# Patient Record
Sex: Female | Born: 1955
Health system: Southern US, Community
[De-identification: ages and names within clinical notes are randomized; demographics above are authoritative.]

## PROBLEM LIST (undated history)

## (undated) DIAGNOSIS — K649 Unspecified hemorrhoids: Secondary | ICD-10-CM

## (undated) DIAGNOSIS — R413 Other amnesia: Secondary | ICD-10-CM

## (undated) DIAGNOSIS — Z9884 Bariatric surgery status: Secondary | ICD-10-CM

## (undated) DIAGNOSIS — N2581 Secondary hyperparathyroidism of renal origin: Secondary | ICD-10-CM

## (undated) DIAGNOSIS — M810 Age-related osteoporosis without current pathological fracture: Secondary | ICD-10-CM

## (undated) DIAGNOSIS — F329 Major depressive disorder, single episode, unspecified: Secondary | ICD-10-CM

## (undated) DIAGNOSIS — R42 Dizziness and giddiness: Secondary | ICD-10-CM

## (undated) DIAGNOSIS — M797 Fibromyalgia: Secondary | ICD-10-CM

## (undated) DIAGNOSIS — M1710 Unilateral primary osteoarthritis, unspecified knee: Secondary | ICD-10-CM

## (undated) DIAGNOSIS — K912 Postsurgical malabsorption, not elsewhere classified: Secondary | ICD-10-CM

## (undated) DIAGNOSIS — E559 Vitamin D deficiency, unspecified: Secondary | ICD-10-CM

## (undated) DIAGNOSIS — M1990 Unspecified osteoarthritis, unspecified site: Secondary | ICD-10-CM

## (undated) DIAGNOSIS — M2550 Pain in unspecified joint: Secondary | ICD-10-CM

## (undated) DIAGNOSIS — T148XXA Other injury of unspecified body region, initial encounter: Secondary | ICD-10-CM

## (undated) DIAGNOSIS — D649 Anemia, unspecified: Secondary | ICD-10-CM

## (undated) DIAGNOSIS — E119 Type 2 diabetes mellitus without complications: Secondary | ICD-10-CM

## (undated) DIAGNOSIS — F419 Anxiety disorder, unspecified: Secondary | ICD-10-CM

## (undated) DIAGNOSIS — D62 Acute posthemorrhagic anemia: Secondary | ICD-10-CM

## (undated) DIAGNOSIS — R2689 Other abnormalities of gait and mobility: Secondary | ICD-10-CM

## (undated) DIAGNOSIS — Z903 Acquired absence of stomach [part of]: Secondary | ICD-10-CM

## (undated) DIAGNOSIS — E876 Hypokalemia: Secondary | ICD-10-CM

## (undated) DIAGNOSIS — F32A Depression, unspecified: Secondary | ICD-10-CM

## (undated) HISTORY — PX: CATARACT EXTRACTION, BILATERAL: SHX1313

## (undated) HISTORY — PX: HERNIA REPAIR: SHX51

## (undated) HISTORY — DX: Hypokalemia: E87.6

## (undated) HISTORY — DX: Acute posthemorrhagic anemia: D62

## (undated) HISTORY — DX: Postsurgical malabsorption, not elsewhere classified: K91.2

## (undated) HISTORY — PX: HAND SURGERY: SHX662

## (undated) HISTORY — DX: Vitamin D deficiency, unspecified: E55.9

## (undated) HISTORY — DX: Anemia, unspecified: D64.9

## (undated) HISTORY — DX: Unilateral primary osteoarthritis, unspecified knee: M17.10

## (undated) HISTORY — DX: Pain in unspecified joint: M25.50

## (undated) HISTORY — DX: Type 2 diabetes mellitus without complications: E11.9

## (undated) HISTORY — DX: Acquired absence of stomach (part of): Z90.3

## (undated) HISTORY — DX: Bariatric surgery status: Z98.84

## (undated) HISTORY — DX: Age-related osteoporosis without current pathological fracture: M81.0

## (undated) HISTORY — PX: CHOLECYSTECTOMY: SHX55

## (undated) HISTORY — DX: Secondary hyperparathyroidism of renal origin: N25.81

---

## 1989-11-08 HISTORY — PX: OTHER SURGICAL HISTORY: SHX169

## 1997-09-01 ENCOUNTER — Ambulatory Visit (HOSPITAL_COMMUNITY): Admission: RE | Admit: 1997-09-01 | Discharge: 1997-09-01 | Payer: Self-pay | Admitting: *Deleted

## 1997-12-26 ENCOUNTER — Other Ambulatory Visit: Admission: RE | Admit: 1997-12-26 | Discharge: 1997-12-26 | Payer: Self-pay | Admitting: *Deleted

## 1998-03-19 ENCOUNTER — Encounter: Payer: Self-pay | Admitting: *Deleted

## 1998-03-19 ENCOUNTER — Ambulatory Visit (HOSPITAL_COMMUNITY): Admission: RE | Admit: 1998-03-19 | Discharge: 1998-03-19 | Payer: Self-pay | Admitting: *Deleted

## 1999-02-06 ENCOUNTER — Other Ambulatory Visit: Admission: RE | Admit: 1999-02-06 | Discharge: 1999-02-06 | Payer: Self-pay | Admitting: *Deleted

## 1999-02-07 ENCOUNTER — Encounter (INDEPENDENT_AMBULATORY_CARE_PROVIDER_SITE_OTHER): Payer: Self-pay

## 1999-02-07 ENCOUNTER — Other Ambulatory Visit: Admission: RE | Admit: 1999-02-07 | Discharge: 1999-02-07 | Payer: Self-pay | Admitting: *Deleted

## 1999-04-24 ENCOUNTER — Ambulatory Visit (HOSPITAL_COMMUNITY): Admission: RE | Admit: 1999-04-24 | Discharge: 1999-04-24 | Payer: Self-pay | Admitting: *Deleted

## 1999-04-24 ENCOUNTER — Encounter: Payer: Self-pay | Admitting: *Deleted

## 2000-02-06 ENCOUNTER — Other Ambulatory Visit: Admission: RE | Admit: 2000-02-06 | Discharge: 2000-02-06 | Payer: Self-pay | Admitting: *Deleted

## 2000-06-08 ENCOUNTER — Ambulatory Visit (HOSPITAL_COMMUNITY): Admission: RE | Admit: 2000-06-08 | Discharge: 2000-06-08 | Payer: Self-pay | Admitting: *Deleted

## 2000-06-08 ENCOUNTER — Encounter: Payer: Self-pay | Admitting: *Deleted

## 2001-02-10 ENCOUNTER — Other Ambulatory Visit: Admission: RE | Admit: 2001-02-10 | Discharge: 2001-02-10 | Payer: Self-pay | Admitting: *Deleted

## 2001-06-15 ENCOUNTER — Ambulatory Visit (HOSPITAL_COMMUNITY): Admission: RE | Admit: 2001-06-15 | Discharge: 2001-06-15 | Payer: Self-pay | Admitting: *Deleted

## 2001-06-15 ENCOUNTER — Encounter: Payer: Self-pay | Admitting: *Deleted

## 2002-02-24 ENCOUNTER — Other Ambulatory Visit: Admission: RE | Admit: 2002-02-24 | Discharge: 2002-02-24 | Payer: Self-pay | Admitting: Obstetrics and Gynecology

## 2002-09-28 ENCOUNTER — Encounter: Payer: Self-pay | Admitting: Obstetrics and Gynecology

## 2002-09-28 ENCOUNTER — Ambulatory Visit (HOSPITAL_COMMUNITY): Admission: RE | Admit: 2002-09-28 | Discharge: 2002-09-28 | Payer: Self-pay | Admitting: Obstetrics and Gynecology

## 2003-03-03 ENCOUNTER — Other Ambulatory Visit: Admission: RE | Admit: 2003-03-03 | Discharge: 2003-03-03 | Payer: Self-pay | Admitting: Obstetrics and Gynecology

## 2003-11-08 ENCOUNTER — Ambulatory Visit (HOSPITAL_COMMUNITY): Admission: RE | Admit: 2003-11-08 | Discharge: 2003-11-08 | Payer: Self-pay | Admitting: Obstetrics and Gynecology

## 2004-05-09 ENCOUNTER — Ambulatory Visit (HOSPITAL_COMMUNITY): Admission: RE | Admit: 2004-05-09 | Discharge: 2004-05-09 | Payer: Self-pay | Admitting: Obstetrics and Gynecology

## 2004-05-09 ENCOUNTER — Encounter (INDEPENDENT_AMBULATORY_CARE_PROVIDER_SITE_OTHER): Payer: Self-pay | Admitting: Specialist

## 2004-11-21 ENCOUNTER — Encounter: Admission: RE | Admit: 2004-11-21 | Discharge: 2004-11-21 | Payer: Self-pay | Admitting: Obstetrics and Gynecology

## 2005-06-05 ENCOUNTER — Encounter: Admission: RE | Admit: 2005-06-05 | Discharge: 2005-06-05 | Payer: Self-pay | Admitting: Obstetrics and Gynecology

## 2005-06-05 IMAGING — US UNKNOWN US STUDY
1 series · 3 of 3 positions shown · non-contrast
Comparison: none

DIGITAL UNILATERAL RIGHT DIAGNOSTIC MAMMOGRAM AND RIGHT BREAST ULTRASOUND:
CLINICAL DATA: Focal tenderness at the 6 o'clock position of the right breast, comes and goes; 
family history of breast cancer in the patient's mother at age 70.

[Series 1: unknown us study · 3 of 3 slices shown]
[im 1/3]
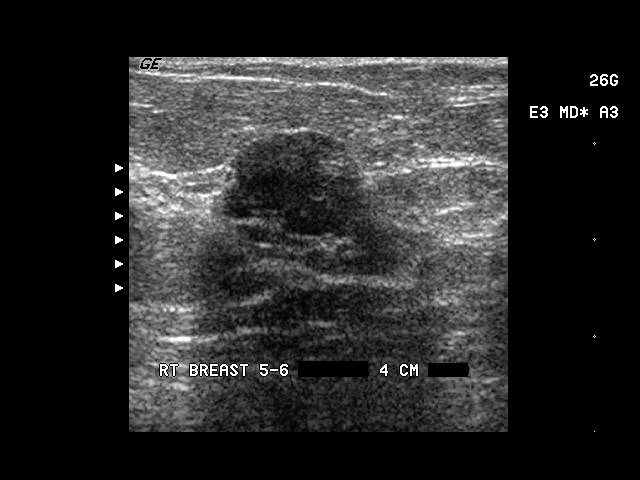
[im 2/3]
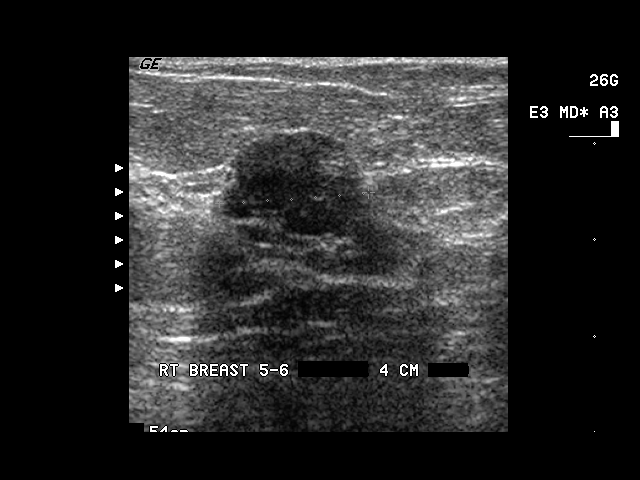
[im 3/3]
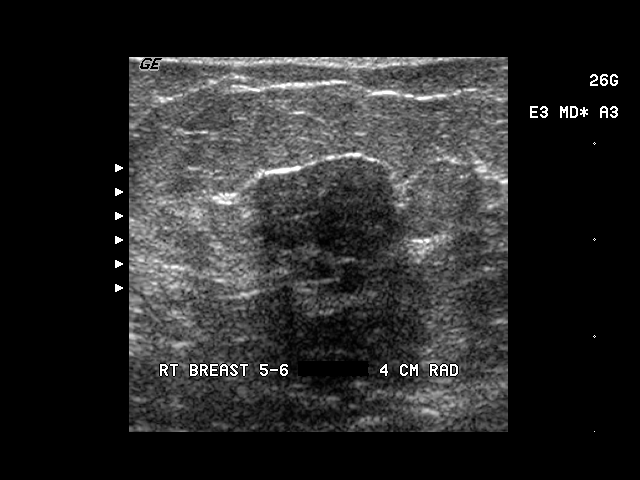

[3 of 3 positions shown; findings below may reference images not displayed]

Comparison studies are dated [DATE], [DATE], and [DATE], as well as the ultrasound core biopsy 
exam from [JI].  The fibrofatty parenchymal pattern is symmetric and stable.  A well-defined mass 
in the lower inner right breast is stable, as well.  This represents the fibroadenoma which was 
biopsied in [JI].  No new dominant masses, suspicious calcifications, or areas of architectural 
distortion are seen bilaterally.

Physical exam of the right breast is normal. The patient describes tenderness at the 5-6 o'clock 
position of the right breast.  Ultrasound in this region reveals a 1.5 cm well-defined solid mass, 
consistent with the previously biopsied fibroadenoma.  This has not changed in size or appearance 
since [JI].
IMPRESSION: Stable fibroadenoma in the lower inner right breast.  There is no specific radiographic evidence of
malignancy bilaterally. Screening mammogram in [DATE] recommended.

ASSESSMENT: Benign - BI-RADS 2

Screening mammogram of both breasts in 5 months.

## 2006-01-19 ENCOUNTER — Encounter: Admission: RE | Admit: 2006-01-19 | Discharge: 2006-01-19 | Payer: Self-pay | Admitting: Obstetrics and Gynecology

## 2007-02-08 ENCOUNTER — Ambulatory Visit (HOSPITAL_COMMUNITY): Admission: RE | Admit: 2007-02-08 | Discharge: 2007-02-08 | Payer: Self-pay | Admitting: Obstetrics and Gynecology

## 2008-02-15 ENCOUNTER — Ambulatory Visit (HOSPITAL_COMMUNITY): Admission: RE | Admit: 2008-02-15 | Discharge: 2008-02-15 | Payer: Self-pay | Admitting: Obstetrics and Gynecology

## 2009-02-21 ENCOUNTER — Ambulatory Visit (HOSPITAL_COMMUNITY): Admission: RE | Admit: 2009-02-21 | Discharge: 2009-02-21 | Payer: Self-pay | Admitting: Obstetrics and Gynecology

## 2010-02-22 ENCOUNTER — Ambulatory Visit (HOSPITAL_COMMUNITY): Admission: RE | Admit: 2010-02-22 | Discharge: 2010-02-22 | Payer: Self-pay | Admitting: Obstetrics and Gynecology

## 2010-03-19 HISTORY — PX: OTHER SURGICAL HISTORY: SHX169

## 2010-10-11 NOTE — Op Note (Signed)
Williams, Bianca            ACCOUNT NO.:  [id number]   MEDICAL RECORD NO.:  [id number]          PATIENT TYPE:  AMB   LOCATION:  SDC                           FACILITY:  [facility]   PHYSICIAN:  Maxie Better, M.D.DATE OF BIRTH:  03/06/1956   DATE OF PROCEDURE:  05/09/2004  DATE OF DISCHARGE:                                 OPERATIVE REPORT   PREOPERATIVE DIAGNOSES:  1.  Postmenopausal bleeding.  2.  Endometrial mass.   PROCEDURE:  1.  Operative hysteroscopy.  2.  Resection of endometrial polyp.  3.  Dilation and evacuation.   POSTOPERATIVE DIAGNOSES:  1.  Endometrial polyp.  2.  Postmenopausal bleeding.   ANESTHESIA:  General.   SURGEON:  Maxie Better, M.D.   DESCRIPTION OF PROCEDURE:  Under adequate general anesthesia, the patient  had been placed in the dorsal lithotomy position.  She was sterilely prepped  and draped in the usual fashion.  The bladder was catheterized of a small  amount of urine.  Examination under anesthesia revealed an axial to  anteverted uterus, no adnexal mass palpable.  A bivalve speculum was placed  in the vagina.  The cervix was noted to be parous.  Single-tooth tenaculum  was placed on the anterior lip of the cervix.  The easily dilated up to #[facility]  [facility] dilator.  A resectoscope with a double loop was introduced into the  uterine cavity without incident.  The right tubal ostia was well seen.  The  left was sclerosed and behind a large, wide-based, polypoid mass in the left  posterior aspect of the uterus close to the tubal ostia.  Using the double  loop, the polypoid mass was then removed.  The cavity was inspected.  No  other lesions were noted, and the cervical canal was inspected, no lesions  noted.  The resectoscope was then removed.  The cavity was then curetted for  scant tissue.  All instruments were then removed from the vagina and  specimen labeled endometrial polyp, and endometrial curettings were sent to  pathology.   Estimated blood loss was minimal.  Sorbitol deficit was 60 mL.  Complication was none.  The patient tolerated the procedure well, was  transferred to the recovery room in stable condition.      [place]/MEDQ  D:  05/09/2004  T:  05/09/2004  Job:  [id number]

## 2011-01-30 ENCOUNTER — Other Ambulatory Visit (HOSPITAL_COMMUNITY): Payer: Self-pay | Admitting: Obstetrics and Gynecology

## 2011-01-30 DIAGNOSIS — Z1231 Encounter for screening mammogram for malignant neoplasm of breast: Secondary | ICD-10-CM

## 2011-02-24 ENCOUNTER — Ambulatory Visit (HOSPITAL_COMMUNITY)
Admission: RE | Admit: 2011-02-24 | Discharge: 2011-02-24 | Disposition: A | Discharge status: Home / Self Care | Payer: BC Managed Care – PPO | Source: Ambulatory Visit | Attending: Obstetrics and Gynecology | Admitting: Obstetrics and Gynecology

## 2011-02-24 DIAGNOSIS — Z1231 Encounter for screening mammogram for malignant neoplasm of breast: Secondary | ICD-10-CM

## 2011-10-02 ENCOUNTER — Other Ambulatory Visit: Payer: Self-pay | Admitting: Orthopedic Surgery

## 2011-10-02 NOTE — Progress Notes (Signed)
Preoperative surgical orders have been place into the Epic hospital system for Bianca Williams on 10/02/2011, 11:47 AM  by Patrica Duel for surgery on 03/08/2012.  Preop Total Knee orders including Bupivacaine On-Q pump, IV Tylenol, and IV Decadron as long as there are no contraindications to the above medications.

## 2012-02-17 ENCOUNTER — Other Ambulatory Visit (HOSPITAL_COMMUNITY): Payer: Self-pay | Admitting: Obstetrics and Gynecology

## 2012-02-17 DIAGNOSIS — Z1231 Encounter for screening mammogram for malignant neoplasm of breast: Secondary | ICD-10-CM

## 2012-02-24 ENCOUNTER — Telehealth: Payer: Self-pay | Admitting: Licensed Clinical Social Worker

## 2012-02-24 ENCOUNTER — Encounter (HOSPITAL_COMMUNITY): Payer: Self-pay | Admitting: Pharmacy Technician

## 2012-02-24 NOTE — Telephone Encounter (Deleted)
Patient called stating that her surgeon did not order a MRI, she wanted to know if Dr. Drue Second would like to see her and order one. The patient states that she discussed having a MRI at the her last visit with Dr. Drue Second.

## 2012-02-25 NOTE — Telephone Encounter (Signed)
Can you call Bianca Williams and let her know that her surgeon would be the best person to assess if she needed repeat MRI. If he didn't order an MRI, I think it is ok for her not to have one. IF she is having fever, chills, nightsweats, would have her come back to see [place]. If back pain -> see surgery or her PCP. thanks

## 2012-02-25 NOTE — Progress Notes (Signed)
Dr. Lequita Halt or Kenard Gower : when you can, we need orders on [facility] - Surg 03/08/12 - coming for preop 03/03/12 thank you

## 2012-02-26 ENCOUNTER — Ambulatory Visit (HOSPITAL_COMMUNITY)
Admission: RE | Admit: 2012-02-26 | Discharge: 2012-02-26 | Disposition: A | Discharge status: Home / Self Care | Payer: BC Managed Care – PPO | Source: Ambulatory Visit | Attending: Obstetrics and Gynecology | Admitting: Obstetrics and Gynecology

## 2012-02-26 DIAGNOSIS — Z1231 Encounter for screening mammogram for malignant neoplasm of breast: Secondary | ICD-10-CM

## 2012-02-26 NOTE — Telephone Encounter (Signed)
This is the incorrect patient and I put the note on the correct patient. Disregard the note on this account.

## 2012-03-01 NOTE — Progress Notes (Signed)
Dr Lequita Halt or Kenard Gower-  NEED PRE OP ORDERS PLEASE   pst appt 03/03/12  [facility]

## 2012-03-03 ENCOUNTER — Encounter (HOSPITAL_COMMUNITY): Payer: Self-pay

## 2012-03-03 ENCOUNTER — Encounter (HOSPITAL_COMMUNITY)
Admission: RE | Admit: 2012-03-03 | Discharge: 2012-03-03 | Disposition: A | Discharge status: Home / Self Care | Payer: BC Managed Care – PPO | Source: Ambulatory Visit | Attending: Orthopedic Surgery | Admitting: Orthopedic Surgery

## 2012-03-03 HISTORY — DX: Age-related osteoporosis without current pathological fracture: M81.0

## 2012-03-03 HISTORY — DX: Major depressive disorder, single episode, unspecified: F32.9

## 2012-03-03 HISTORY — DX: Depression, unspecified: F32.A

## 2012-03-03 HISTORY — DX: Anxiety disorder, unspecified: F41.9

## 2012-03-03 HISTORY — DX: Unspecified hemorrhoids: K64.9

## 2012-03-03 HISTORY — DX: Unspecified osteoarthritis, unspecified site: M19.90

## 2012-03-03 HISTORY — DX: Dizziness and giddiness: R42

## 2012-03-03 HISTORY — DX: Fibromyalgia: M79.7

## 2012-03-03 HISTORY — DX: Other injury of unspecified body region, initial encounter: T14.8XXA

## 2012-03-03 HISTORY — DX: Other abnormalities of gait and mobility: R26.89

## 2012-03-03 HISTORY — DX: Other amnesia: R41.3

## 2012-03-03 LAB — SURGICAL PCR SCREEN
MRSA, PCR: NEGATIVE
Staphylococcus aureus: NEGATIVE

## 2012-03-03 NOTE — Pre-Procedure Instructions (Signed)
CBC WITH DIFF, CMET, PT, PTT  WERE DONE 02/20/12 AT [facility] IN [place]-REPORTS ARE ON PT'S CHART AND OK TO USE PER ANESTHESIOLOGIST'S GUIDELINES. PT HAS CXR REPORT 02/12/12 FROM [facility]-REPORT ON CHART. PT HAS EKG REPORT July 19. 2003 FROM DR. SISTASIS AND HIS NOTE OF MEDICAL CLEARANCE FOR HER RIGHT TOTAL KNEE REPLACEMENT -REPORTS ON CHART. PT BROUGHT HER ENVELOPE WITH H&P FROM DR. ALUISIO'S OFFICE AND RECORDS PLACED ON HER CHART. PCR WAS DONE TODAY - PREOP AT [facility]--PT WANTS TO WAIT UNTIL DAY OF SURGERY TO DO HER T/S. PREOP ORDERS FROM DR. Lequita Halt NOT YET IN EPIC BUT HAVE BEEN REQUESTED.

## 2012-03-03 NOTE — Patient Instructions (Signed)
YOUR SURGERY IS SCHEDULED AT [facility]  ON:   Monday  10/14  AT 7:15 AM  REPORT TO [place] SHORT STAY [facility]ENTER AT:  5:15 AM      PHONE # FOR SHORT STAY IS [phone]  DO NOT EAT OR DRINK ANYTHING AFTER MIDNIGHT THE NIGHT BEFORE YOUR SURGERY.  YOU MAY BRUSH YOUR TEETH, RINSE OUT YOUR MOUTH--BUT NO WATER, NO FOOD, NO CHEWING GUM, NO MINTS, NO CANDIES, NO CHEWING TOBACCO.  PLEASE TAKE THE FOLLOWING MEDICATIONS THE AM OF YOUR SURGERY WITH A FEW SIPS OF WATER:   NO MEDICINES TO TAKE--BRING YOUR EYE DROPS  IF YOU USE INHALERS--USE YOUR INHALERS THE AM OF YOUR SURGERY AND BRING INHALERS TO THE HOSPITAL -TAKE TO SURGERY.    IF YOU ARE DIABETIC:  DO NOT TAKE ANY DIABETIC MEDICATIONS THE AM OF YOUR SURGERY.  IF YOU TAKE INSULIN IN THE EVENINGS--PLEASE ONLY TAKE 1/2 NORMAL EVENING DOSE THE NIGHT BEFORE YOUR SURGERY.  NO INSULIN THE AM OF YOUR SURGERY.  IF YOU HAVE SLEEP APNEA AND USE CPAP OR BIPAP--PLEASE BRING THE MASK AND THE TUBING.  DO NOT BRING YOUR MACHINE.  DO NOT BRING VALUABLES, MONEY, CREDIT CARDS.  DO NOT WEAR JEWELRY, MAKE-UP, NAIL POLISH AND NO METAL PINS OR CLIPS IN YOUR HAIR. CONTACT LENS, DENTURES / PARTIALS, GLASSES SHOULD NOT BE WORN TO SURGERY AND IN MOST CASES-HEARING AIDS WILL NEED TO BE REMOVED.  BRING YOUR GLASSES CASE, ANY EQUIPMENT NEEDED FOR YOUR CONTACT LENS. FOR PATIENTS ADMITTED TO THE HOSPITAL--CHECK OUT TIME THE DAY OF DISCHARGE IS 11:00 AM.  ALL INPATIENT ROOMS ARE PRIVATE - WITH BATHROOM, TELEPHONE, TELEVISION AND WIFI INTERNET.  IF YOU ARE BEING DISCHARGED THE SAME DAY OF YOUR SURGERY--YOU CAN NOT DRIVE YOURSELF HOME--AND SHOULD NOT GO HOME ALONE BY TAXI OR BUS.  NO DRIVING OR OPERATING MACHINERY FOR 24 HOURS FOLLOWING ANESTHESIA / PAIN MEDICATIONS.  PLEASE MAKE ARRANGEMENTS FOR SOMEONE TO BE WITH YOU AT HOME THE FIRST 24 HOURS AFTER SURGERY. RESPONSIBLE DRIVER'S NAME___________________________                                               PHONE #    _______________________                                  PLEASE READ OVER ANY  FACT SHEETS THAT YOU WERE GIVEN: MRSA INFORMATION, BLOOD TRANSFUSION INFORMATION, INCENTIVE SPIROMETER INFORMATION.

## 2012-03-04 ENCOUNTER — Other Ambulatory Visit: Payer: Self-pay | Admitting: Orthopedic Surgery

## 2012-03-04 MED ORDER — BUPIVACAINE 0.25 % ON-Q PUMP SINGLE CATH 300ML: 300.0000 mL | INJECTION | Status: DC

## 2012-03-04 MED ORDER — DEXAMETHASONE SODIUM PHOSPHATE 10 MG/ML IJ SOLN: 10.0000 mg | Freq: Once | INTRAMUSCULAR | Status: DC

## 2012-03-04 NOTE — Progress Notes (Signed)
Preoperative surgical orders have been place into the Epic hospital system for Bianca Williams on 03/04/2012, 5:50 PM  by Patrica Duel for surgery on 03/08/12.  Preop Total Knee orders including Bupivacaine On-Q pump, IV Tylenol, and IV Decadron as long as there are no contraindications to the above medications. Bianca Peace, PA-C  These orders were originally entered on 10/02/2011 but due to a default 90 day rule, the orders were deleted automatically by the EPIC system requiring them to be reentered again. Bianca Peace, PA-C

## 2012-03-07 ENCOUNTER — Other Ambulatory Visit: Payer: Self-pay | Admitting: Orthopedic Surgery

## 2012-03-07 NOTE — Anesthesia Preprocedure Evaluation (Addendum)
Anesthesia Evaluation  Patient identified by MRN, date of birth, ID band Patient awake    Reviewed: Allergy & Precautions, H&P , NPO status , Patient's Chart, lab work & pertinent test results  Airway Mallampati: II TM Distance: >3 FB Neck ROM: Full    Dental No notable dental hx.    Pulmonary neg pulmonary ROS,  breath sounds clear to auscultation  Pulmonary exam normal       Cardiovascular negative cardio ROS  Rhythm:Regular Rate:Normal     Neuro/Psych PSYCHIATRIC DISORDERS Anxiety Depression  Neuromuscular disease    GI/Hepatic negative GI ROS, Neg liver ROS,   Endo/Other  S/P gastric bypass. Diabetes resolved.  Renal/GU negative Renal ROS  negative genitourinary   Musculoskeletal  (+) Fibromyalgia -  Abdominal   Peds negative pediatric ROS (+)  Hematology negative hematology ROS (+)   Anesthesia Other Findings   Reproductive/Obstetrics negative OB ROS                           Anesthesia Physical Anesthesia Plan  ASA: II  Anesthesia Plan: General   Post-op Pain Management:    Induction: Intravenous  Airway Management Planned:   Additional Equipment:   Intra-op Plan:   Post-operative Plan: Extubation in OR  Informed Consent: I have reviewed the patients History and Physical, chart, labs and discussed the procedure including the risks, benefits and alternatives for the proposed anesthesia with the patient or authorized representative who has indicated his/her understanding and acceptance.   Dental advisory given  Plan Discussed with: CRNA  Anesthesia Plan Comments: (Discussed r/b/a general versus spinal. Patient had a severe headache with a spinal in the past and prefers general.)       Anesthesia Quick Evaluation

## 2012-03-07 NOTE — H&P (Signed)
Aerith M. Winterrowd  DOB: 07/07/1955 Married / Language: English / Race: White Female  Date of Admission:  03/08/12  Chief Complaint:  Right Knee Pain  History of Present Illness The patient is a 56 year old female who comes in for a preoperative History and Physical. The patient is scheduled for a right total knee arthroplasty to be performed by Dr. Frank V. Aluisio, MD at [facility] on 03/08/2012. The patient is a 56 year old female who presents with knee complaints. The patient reports right knee symptoms including: pain (anterior), locking, stiffness and grinding (after prolonged standing) which began 5 year(s) ago without any known injury (Patient states that both knee have been hurting for awhile. She said that the right knee is worse. She tripped in March and said that her knee locked up. She saw Tracey Shuford and was told to follow up with Dr.Aluisio. She said after losing weight her knees felt better for awhile.). Prior to being seen today the patient was previously evaluated by a colleague. She states that the right knee hurts more than the left. She had knee problems for a long time but after she lost weight after the bypass surgery the knees did feel better. Unfortunately for the past nine months to one year she has had increased pain in the right knee. The knee occasionally will swell on her. It has given out at times. She is not having any lower extremity weakness or paresthesia with that. She is not having any hip pain or back pain with this. She would like to proceed with knee replacement surgery. They have been treated conservatively in the past for the above stated problem and despite conservative measures, they continue to have progressive pain and severe functional limitations and dysfunction. They have failed non-operative management including home exercise, medications, and injections. It is felt that they would benefit from undergoing total joint  replacement. Risks and benefits of the procedure have been discussed with the patient and they elect to proceed with surgery. There are no active contraindications to surgery such as ongoing infection or rapidly progressive neurological disease.   Problem List Osteoarthritis, Knee (715.96)   Allergies Naproxen Sodium *ANALGESICS - ANTI-INFLAMMATORY* VICODIN. 02/17/2008 KEFLEX. 02/17/2008 SULFA DRUGS. 02/17/2008 MORPHINE. 02/08/2010   Family History Heart disease in female family member before age 55 Osteoarthritis. mother Drug / Alcohol Addiction. brother Heart Disease. mother, brother and grandfather fathers side Heart disease in female family member before age 65 Cerebrovascular Accident. mother Cancer. mother and father   Social History Current work status. working full time Drug/Alcohol Rehab (Currently). no Exercise. Exercises weekly; does running / walking Alcohol use. current drinker; drinks beer and wine; 5-7 per week Tobacco use. never smoker Pain Contract. no Drug/Alcohol Rehab (Previously). no Illicit drug use. no Number of flights of stairs before winded. 2-3 Current occupation. office manager Living situation. live with spouse Marital status. married Post-Surgical Plans. Plan is to go home. Advance Directives. Living Will   Past Surgical History Fracture. left Cesarean Delivery. 1 time Breast Biopsy. left Gallbladder Surgery. laporoscopic Rotator Cuff Repair. left Other Orthopaedic Surgery Colon Polyp Removal - Colonoscopy Dilation and Curettage of Uterus   Past Medical History Osteoporosis Diabetes Mellitus, Type II. No longer on medications following gastric bypass Fibromyalgia Vertigo Anxiety Disorder Depression. Past History Impaired Vision. Wears glasses Hemorrhoids Menopause Measles Mumps Distal radius fracture, left (813.42)  Review of Systems General:Not Present- Chills, Fever, Night Sweats,  Fatigue, Weight Gain, Weight Loss and Memory Loss. Skin:Not Present-   Hives, Itching, Rash, Eczema and Lesions. HEENT:Not Present- Tinnitus, Headache, Double Vision, Visual Loss, Hearing Loss and Dentures. Respiratory:Not Present- Shortness of breath with exertion, Shortness of breath at rest, Allergies, Coughing up blood and Chronic Cough. Cardiovascular:Not Present- Chest Pain, Racing/skipping heartbeats, Difficulty Breathing Lying Down, Murmur, Swelling and Palpitations. Gastrointestinal:Not Present- Bloody Stool, Heartburn, Abdominal Pain, Vomiting, Nausea, Constipation, Diarrhea, Difficulty Swallowing, Jaundice and Loss of appetitie. Female Genitourinary:Not Present- Blood in Urine, Urinary frequency, Weak urinary stream, Discharge, Flank Pain, Incontinence, Painful Urination, Urgency, Urinary Retention and Urinating at Night. Musculoskeletal:Present- Morning Stiffness. Not Present- Muscle Weakness, Muscle Pain, Joint Swelling, Joint Pain, Back Pain and Spasms. Neurological:Not Present- Tremor, Dizziness, Blackout spells, Paralysis, Difficulty with balance and Weakness. Psychiatric:Not Present- Insomnia.   Vitals Weight: 121 lb Height: 61 in Body Surface Area: 1.54 m Body Mass Index: 22.86 kg/m Pulse: 60 (Regular) Resp.: 14 (Unlabored) BP: 122/78 (Sitting, Right Arm, Standard)    Physical Exam The physical exam findings are as follows:  Note: Patient is a 56 year old female with continued bilateral knee pain.   General Mental Status - Alert, cooperative and good historian. General Appearance- pleasant. Not in acute distress. Orientation- Oriented X3. Build & Nutrition- Well nourished and Well developed.   Head and Neck Head- normocephalic, atraumatic . Neck Global Assessment- supple. no bruit auscultated on the right and no bruit auscultated on the left.   Eye Pupil- Bilateral- Regular and Round. Note: wears glasses Motion- Bilateral-  EOMI.   Chest and Lung Exam Auscultation: Breath sounds:- clear at anterior chest wall and - clear at posterior chest wall. Adventitious sounds:- No Adventitious sounds.   Cardiovascular Auscultation:Rhythm- Regular rate and rhythm. Heart Sounds- S1/S2 normal Murmurs & Other Heart Sounds:Auscultation of the heart reveals - No Murmurs.   Abdomen Palpation/Percussion:Tenderness- Abdomen is non-tender to palpation. Rigidity (guarding)- Abdomen is soft. Auscultation:Auscultation of the abdomen reveals - Bowel sounds normal.   Female Genitourinary Not done, not pertinent to present illness  Musculoskeletal Very pleasant, well developed female alert and oriented in no apparent distress. Evaluation of her hips show normal range of motion with no discomfort. Her left knee shows no effusion. Slight varus deformity. Marked crepitus on range of motion. She is slightly tender medial greater than lateral, no instability. Right knee no effusion. She is tender along the medial aspect of the knee. She has a varus deformity. Range about 10 to 120. There is no lateral tenderness or instability noted. Pulses, sensation and motor are intact both lower extremities.  RADIOGRAPHS: AP both knees and lateral show endstage arthritic change of both knees, right worse than the left. She has bone on bone in the medial and patellofemoral compartments of both knees. She has tibial subluxation worse on the right than the left.  Assessment & Plan Osteoarthritis, Knee (715.96) Impression: Right Knee  Note: Patient is for a right total knee replacement by Dr. Aluisio.  Plan is to go home.  PCP - Dr. Rowena Sistasis  Signed electronically by DREW L PERKINS, PA-C 

## 2012-03-08 ENCOUNTER — Encounter (HOSPITAL_COMMUNITY): Payer: Self-pay | Admitting: *Deleted

## 2012-03-08 ENCOUNTER — Encounter (HOSPITAL_COMMUNITY)
Admission: RE | Disposition: A | Discharge status: Home Health | Payer: Self-pay | Source: Ambulatory Visit | Attending: Orthopedic Surgery

## 2012-03-08 ENCOUNTER — Encounter (HOSPITAL_COMMUNITY): Payer: Self-pay | Admitting: Anesthesiology

## 2012-03-08 ENCOUNTER — Ambulatory Visit (HOSPITAL_COMMUNITY): Payer: BC Managed Care – PPO | Admitting: Anesthesiology

## 2012-03-08 ENCOUNTER — Inpatient Hospital Stay (HOSPITAL_COMMUNITY)
Admission: RE | Admit: 2012-03-08 | Discharge: 2012-03-11 | DRG: 209 | Disposition: A | Discharge status: Home Health | Payer: BC Managed Care – PPO | Source: Ambulatory Visit | Attending: Orthopedic Surgery | Admitting: Orthopedic Surgery

## 2012-03-08 DIAGNOSIS — F3289 Other specified depressive episodes: Secondary | ICD-10-CM | POA: Diagnosis present

## 2012-03-08 DIAGNOSIS — D62 Acute posthemorrhagic anemia: Secondary | ICD-10-CM | POA: Diagnosis not present

## 2012-03-08 DIAGNOSIS — M179 Osteoarthritis of knee, unspecified: Secondary | ICD-10-CM

## 2012-03-08 DIAGNOSIS — F411 Generalized anxiety disorder: Secondary | ICD-10-CM | POA: Diagnosis present

## 2012-03-08 DIAGNOSIS — Z01812 Encounter for preprocedural laboratory examination: Secondary | ICD-10-CM

## 2012-03-08 DIAGNOSIS — M1710 Unilateral primary osteoarthritis, unspecified knee: Principal | ICD-10-CM | POA: Diagnosis present

## 2012-03-08 DIAGNOSIS — Z96659 Presence of unspecified artificial knee joint: Secondary | ICD-10-CM

## 2012-03-08 DIAGNOSIS — F329 Major depressive disorder, single episode, unspecified: Secondary | ICD-10-CM | POA: Diagnosis present

## 2012-03-08 DIAGNOSIS — IMO0001 Reserved for inherently not codable concepts without codable children: Secondary | ICD-10-CM | POA: Diagnosis present

## 2012-03-08 DIAGNOSIS — E876 Hypokalemia: Secondary | ICD-10-CM | POA: Diagnosis not present

## 2012-03-08 HISTORY — DX: Unilateral primary osteoarthritis, unspecified knee: M17.10

## 2012-03-08 HISTORY — DX: Osteoarthritis of knee, unspecified: M17.9

## 2012-03-08 HISTORY — PX: TOTAL KNEE ARTHROPLASTY: SHX125

## 2012-03-08 LAB — URINALYSIS, ROUTINE W REFLEX MICROSCOPIC
Glucose, UA: NEGATIVE mg/dL
Hgb urine dipstick: NEGATIVE
Ketones, ur: NEGATIVE mg/dL
Leukocytes, UA: NEGATIVE
Protein, ur: NEGATIVE mg/dL
Urobilinogen, UA: 1 mg/dL (ref 0.0–1.0)

## 2012-03-08 LAB — TYPE AND SCREEN
ABO/RH(D): O POS
Antibody Screen: NEGATIVE

## 2012-03-08 SURGERY — ARTHROPLASTY, KNEE, TOTAL
Anesthesia: General | Site: Knee | Laterality: Right | Wound class: Clean

## 2012-03-08 MED ORDER — HYDROMORPHONE 0.3 MG/ML IV SOLN
INTRAVENOUS | Status: AC
  Filled 2012-03-08: qty 25

## 2012-03-08 MED ORDER — DIPHENHYDRAMINE HCL 12.5 MG/5ML PO ELIX: 12.5000 mg | ORAL_SOLUTION | ORAL | Status: DC | PRN

## 2012-03-08 MED ORDER — ONDANSETRON HCL 4 MG/2ML IJ SOLN
4.0000 mg | Freq: Four times a day (QID) | INTRAMUSCULAR | Status: DC | PRN
  Administered 2012-03-08: 4 mg via INTRAVENOUS
  Filled 2012-03-08 (×2): qty 2

## 2012-03-08 MED ORDER — SODIUM CHLORIDE 0.9 % IR SOLN
Status: DC | PRN
  Administered 2012-03-08: 1000 mL

## 2012-03-08 MED ORDER — ROCURONIUM BROMIDE 100 MG/10ML IV SOLN
INTRAVENOUS | Status: DC | PRN
  Administered 2012-03-08: 30 mg via INTRAVENOUS

## 2012-03-08 MED ORDER — DEXTROSE-NACL 5-0.9 % IV SOLN
INTRAVENOUS | Status: DC
  Administered 2012-03-08 – 2012-03-09 (×2): via INTRAVENOUS

## 2012-03-08 MED ORDER — BUPIVACAINE 0.25 % ON-Q PUMP SINGLE CATH 300ML
INJECTION | Status: DC | PRN
  Administered 2012-03-08: 300 mL

## 2012-03-08 MED ORDER — DEXTROSE 5 % IV SOLN
3.0000 g | INTRAVENOUS | Status: AC
  Administered 2012-03-08: 2 g via INTRAVENOUS
  Filled 2012-03-08: qty 3000

## 2012-03-08 MED ORDER — KETAMINE HCL 10 MG/ML IJ SOLN
INTRAMUSCULAR | Status: DC | PRN
  Administered 2012-03-08 (×3): 1 mg via INTRAVENOUS
  Administered 2012-03-08: 25 mg via INTRAVENOUS
  Administered 2012-03-08 (×2): 1 mg via INTRAVENOUS

## 2012-03-08 MED ORDER — ACETAMINOPHEN 10 MG/ML IV SOLN
INTRAVENOUS | Status: DC | PRN
  Administered 2012-03-08: 1000 mg via INTRAVENOUS

## 2012-03-08 MED ORDER — METHOCARBAMOL 500 MG PO TABS
500.0000 mg | ORAL_TABLET | Freq: Four times a day (QID) | ORAL | Status: DC | PRN
  Administered 2012-03-09 – 2012-03-10 (×2): 500 mg via ORAL
  Filled 2012-03-08 (×2): qty 1

## 2012-03-08 MED ORDER — NEOSTIGMINE METHYLSULFATE 1 MG/ML IJ SOLN
INTRAMUSCULAR | Status: DC | PRN
  Administered 2012-03-08: 2 mg via INTRAVENOUS

## 2012-03-08 MED ORDER — 0.9 % SODIUM CHLORIDE (POUR BTL) OPTIME
TOPICAL | Status: DC | PRN
  Administered 2012-03-08: 1000 mL

## 2012-03-08 MED ORDER — ONDANSETRON HCL 4 MG/2ML IJ SOLN
4.0000 mg | Freq: Four times a day (QID) | INTRAMUSCULAR | Status: DC | PRN
  Administered 2012-03-09 (×2): 4 mg via INTRAVENOUS
  Filled 2012-03-08 (×3): qty 2

## 2012-03-08 MED ORDER — DOCUSATE SODIUM 100 MG PO CAPS
100.0000 mg | ORAL_CAPSULE | Freq: Two times a day (BID) | ORAL | Status: DC
  Administered 2012-03-08 – 2012-03-10 (×6): 100 mg via ORAL

## 2012-03-08 MED ORDER — MIDAZOLAM HCL 5 MG/5ML IJ SOLN
INTRAMUSCULAR | Status: DC | PRN
  Administered 2012-03-08: 2 mg via INTRAVENOUS

## 2012-03-08 MED ORDER — BISACODYL 10 MG RE SUPP: 10.0000 mg | Freq: Every day | RECTAL | Status: DC | PRN

## 2012-03-08 MED ORDER — ACETAMINOPHEN 10 MG/ML IV SOLN: 1000.0000 mg | Freq: Once | INTRAVENOUS | Status: DC

## 2012-03-08 MED ORDER — FLEET ENEMA 7-19 GM/118ML RE ENEM: 1.0000 | ENEMA | Freq: Once | RECTAL | Status: AC | PRN

## 2012-03-08 MED ORDER — HYDROMORPHONE HCL PF 1 MG/ML IJ SOLN: 0.2500 mg | INTRAMUSCULAR | Status: DC | PRN

## 2012-03-08 MED ORDER — PHENOL 1.4 % MT LIQD: 1.0000 | OROMUCOSAL | Status: DC | PRN

## 2012-03-08 MED ORDER — SODIUM CHLORIDE 0.9 % IV SOLN: INTRAVENOUS | Status: DC

## 2012-03-08 MED ORDER — PROMETHAZINE HCL 25 MG/ML IJ SOLN: 6.2500 mg | INTRAMUSCULAR | Status: DC | PRN

## 2012-03-08 MED ORDER — DIPHENHYDRAMINE HCL 12.5 MG/5ML PO ELIX: 12.5000 mg | ORAL_SOLUTION | Freq: Four times a day (QID) | ORAL | Status: DC | PRN

## 2012-03-08 MED ORDER — METHOCARBAMOL 100 MG/ML IJ SOLN
500.0000 mg | Freq: Four times a day (QID) | INTRAVENOUS | Status: DC | PRN
  Administered 2012-03-08 – 2012-03-09 (×3): 500 mg via INTRAVENOUS
  Filled 2012-03-08 (×3): qty 5

## 2012-03-08 MED ORDER — POLYETHYLENE GLYCOL 3350 17 G PO PACK: 17.0000 g | PACK | Freq: Every day | ORAL | Status: DC | PRN

## 2012-03-08 MED ORDER — HYDROMORPHONE HCL PF 1 MG/ML IJ SOLN
INTRAMUSCULAR | Status: DC | PRN
  Administered 2012-03-08 (×2): 0.5 mg via INTRAVENOUS
  Administered 2012-03-08: 1 mg via INTRAVENOUS

## 2012-03-08 MED ORDER — CEFAZOLIN SODIUM-DEXTROSE 2-3 GM-% IV SOLR
INTRAVENOUS | Status: AC
  Filled 2012-03-08: qty 50

## 2012-03-08 MED ORDER — ACETAMINOPHEN 325 MG PO TABS
650.0000 mg | ORAL_TABLET | Freq: Four times a day (QID) | ORAL | Status: DC | PRN
  Administered 2012-03-09: 650 mg via ORAL
  Filled 2012-03-08: qty 2

## 2012-03-08 MED ORDER — DIPHENHYDRAMINE HCL 50 MG/ML IJ SOLN: 12.5000 mg | Freq: Four times a day (QID) | INTRAMUSCULAR | Status: DC | PRN

## 2012-03-08 MED ORDER — CHLORHEXIDINE GLUCONATE 4 % EX LIQD
60.0000 mL | Freq: Once | CUTANEOUS | Status: DC
  Filled 2012-03-08: qty 60

## 2012-03-08 MED ORDER — ACETAMINOPHEN 10 MG/ML IV SOLN
INTRAVENOUS | Status: AC
  Filled 2012-03-08: qty 100

## 2012-03-08 MED ORDER — HYDROMORPHONE HCL 2 MG PO TABS
2.0000 mg | ORAL_TABLET | ORAL | Status: DC | PRN
  Administered 2012-03-09 (×2): 2 mg via ORAL
  Administered 2012-03-10 (×3): 4 mg via ORAL
  Administered 2012-03-10 (×2): 2 mg via ORAL
  Administered 2012-03-11: 4 mg via ORAL
  Administered 2012-03-11 (×2): 2 mg via ORAL
  Filled 2012-03-08: qty 1
  Filled 2012-03-08: qty 2
  Filled 2012-03-08 (×3): qty 1
  Filled 2012-03-08: qty 2
  Filled 2012-03-08: qty 1
  Filled 2012-03-08: qty 2
  Filled 2012-03-08: qty 1
  Filled 2012-03-08: qty 2

## 2012-03-08 MED ORDER — BUPIVACAINE 0.25 % ON-Q PUMP SINGLE CATH 300ML
INJECTION | Status: AC
  Filled 2012-03-08: qty 300

## 2012-03-08 MED ORDER — ONDANSETRON HCL 4 MG PO TABS
4.0000 mg | ORAL_TABLET | Freq: Four times a day (QID) | ORAL | Status: DC | PRN
  Administered 2012-03-10 – 2012-03-11 (×2): 4 mg via ORAL
  Filled 2012-03-08 (×2): qty 1

## 2012-03-08 MED ORDER — RIVAROXABAN 10 MG PO TABS
10.0000 mg | ORAL_TABLET | Freq: Every day | ORAL | Status: DC
  Administered 2012-03-09 – 2012-03-11 (×3): 10 mg via ORAL
  Filled 2012-03-08 (×4): qty 1

## 2012-03-08 MED ORDER — METOCLOPRAMIDE HCL 10 MG PO TABS: 5.0000 mg | ORAL_TABLET | Freq: Three times a day (TID) | ORAL | Status: DC | PRN

## 2012-03-08 MED ORDER — MENTHOL 3 MG MT LOZG: 1.0000 | LOZENGE | OROMUCOSAL | Status: DC | PRN

## 2012-03-08 MED ORDER — ACETAMINOPHEN 650 MG RE SUPP: 650.0000 mg | Freq: Four times a day (QID) | RECTAL | Status: DC | PRN

## 2012-03-08 MED ORDER — HYDROMORPHONE 0.3 MG/ML IV SOLN
INTRAVENOUS | Status: DC
  Administered 2012-03-08: 09:00:00 via INTRAVENOUS
  Administered 2012-03-08: 0.4 mg via INTRAVENOUS
  Administered 2012-03-08: 0.399 mg via INTRAVENOUS
  Administered 2012-03-08 – 2012-03-09 (×2): 0.2 mg via INTRAVENOUS
  Administered 2012-03-09: 0.799 mg via INTRAVENOUS

## 2012-03-08 MED ORDER — BUPIVACAINE ON-Q PAIN PUMP (FOR ORDER SET NO CHG)
INJECTION | Status: DC
  Filled 2012-03-08: qty 1

## 2012-03-08 MED ORDER — ACETAMINOPHEN 10 MG/ML IV SOLN
1000.0000 mg | Freq: Four times a day (QID) | INTRAVENOUS | Status: AC
  Administered 2012-03-08 – 2012-03-09 (×4): 1000 mg via INTRAVENOUS
  Filled 2012-03-08 (×7): qty 100

## 2012-03-08 MED ORDER — CEFAZOLIN SODIUM 1-5 GM-% IV SOLN
1.0000 g | Freq: Four times a day (QID) | INTRAVENOUS | Status: AC
  Administered 2012-03-08 (×2): 1 g via INTRAVENOUS
  Filled 2012-03-08 (×2): qty 50

## 2012-03-08 MED ORDER — TRAMADOL HCL 50 MG PO TABS
50.0000 mg | ORAL_TABLET | Freq: Four times a day (QID) | ORAL | Status: DC | PRN
  Administered 2012-03-08: 50 mg via ORAL
  Filled 2012-03-08: qty 1

## 2012-03-08 MED ORDER — LACTATED RINGERS IV SOLN
INTRAVENOUS | Status: DC | PRN
  Administered 2012-03-08 (×2): via INTRAVENOUS

## 2012-03-08 MED ORDER — SODIUM CHLORIDE 0.9 % IJ SOLN: 9.0000 mL | INTRAMUSCULAR | Status: DC | PRN

## 2012-03-08 MED ORDER — METOCLOPRAMIDE HCL 5 MG/ML IJ SOLN
INTRAMUSCULAR | Status: DC | PRN
  Administered 2012-03-08: 10 mg via INTRAVENOUS

## 2012-03-08 MED ORDER — ONDANSETRON HCL 4 MG/2ML IJ SOLN
INTRAMUSCULAR | Status: DC | PRN
  Administered 2012-03-08: 4 mg via INTRAVENOUS

## 2012-03-08 MED ORDER — PROPOFOL 10 MG/ML IV BOLUS
INTRAVENOUS | Status: DC | PRN
  Administered 2012-03-08: 150 mg via INTRAVENOUS

## 2012-03-08 MED ORDER — DEXAMETHASONE SODIUM PHOSPHATE 4 MG/ML IJ SOLN
INTRAMUSCULAR | Status: DC | PRN
  Administered 2012-03-08: 10 mg via INTRAVENOUS

## 2012-03-08 MED ORDER — NALOXONE HCL 0.4 MG/ML IJ SOLN: 0.4000 mg | INTRAMUSCULAR | Status: DC | PRN

## 2012-03-08 MED ORDER — GLYCOPYRROLATE 0.2 MG/ML IJ SOLN
INTRAMUSCULAR | Status: DC | PRN
  Administered 2012-03-08 (×2): 0.1 mg via INTRAVENOUS

## 2012-03-08 MED ORDER — FENTANYL CITRATE 0.05 MG/ML IJ SOLN
INTRAMUSCULAR | Status: DC | PRN
  Administered 2012-03-08 (×4): 50 ug via INTRAVENOUS

## 2012-03-08 MED ORDER — METOCLOPRAMIDE HCL 5 MG/ML IJ SOLN
5.0000 mg | Freq: Three times a day (TID) | INTRAMUSCULAR | Status: DC | PRN
  Administered 2012-03-08: 10 mg via INTRAVENOUS
  Filled 2012-03-08: qty 2

## 2012-03-08 SURGICAL SUPPLY — 52 items
BAG SPEC THK2 15X12 ZIP CLS (MISCELLANEOUS) ×1
BAG ZIPLOCK 12X15 (MISCELLANEOUS) ×2 IMPLANT
BANDAGE ELASTIC 6 VELCRO ST LF (GAUZE/BANDAGES/DRESSINGS) ×2 IMPLANT
BANDAGE ESMARK 6X9 LF (GAUZE/BANDAGES/DRESSINGS) ×1 IMPLANT
BLADE SAG 18X100X1.27 (BLADE) ×2 IMPLANT
BLADE SAW SGTL 11.0X1.19X90.0M (BLADE) ×2 IMPLANT
BNDG CMPR 9X6 STRL LF SNTH (GAUZE/BANDAGES/DRESSINGS) ×1
BNDG ESMARK 6X9 LF (GAUZE/BANDAGES/DRESSINGS) ×2
BOWL SMART MIX CTS (DISPOSABLE) ×2 IMPLANT
CATH KIT ON-Q SILVERSOAK 5 (CATHETERS) ×1 IMPLANT
CATH KIT ON-Q SILVERSOAK 5IN (CATHETERS) ×2 IMPLANT
CEMENT HV SMART SET (Cement) ×4 IMPLANT
CLOTH BEACON ORANGE TIMEOUT ST (SAFETY) ×2 IMPLANT
CUFF TOURN SGL QUICK 34 (TOURNIQUET CUFF) ×2
CUFF TRNQT CYL 34X4X40X1 (TOURNIQUET CUFF) ×1 IMPLANT
DRAPE EXTREMITY T 121X128X90 (DRAPE) ×2 IMPLANT
DRAPE POUCH INSTRU U-SHP 10X18 (DRAPES) ×2 IMPLANT
DRAPE U-SHAPE 47X51 STRL (DRAPES) ×2 IMPLANT
DRSG ADAPTIC 3X8 NADH LF (GAUZE/BANDAGES/DRESSINGS) ×2 IMPLANT
DRSG PAD ABDOMINAL 8X10 ST (GAUZE/BANDAGES/DRESSINGS) ×1 IMPLANT
DURAPREP 26ML APPLICATOR (WOUND CARE) ×2 IMPLANT
ELECT REM PT RETURN 9FT ADLT (ELECTROSURGICAL) ×2
ELECTRODE REM PT RTRN 9FT ADLT (ELECTROSURGICAL) ×1 IMPLANT
EVACUATOR 1/8 PVC DRAIN (DRAIN) ×2 IMPLANT
FACESHIELD LNG OPTICON STERILE (SAFETY) ×10 IMPLANT
GLOVE BIO SURGEON STRL SZ8 (GLOVE) ×2 IMPLANT
GLOVE BIOGEL PI IND STRL 8 (GLOVE) ×2 IMPLANT
GLOVE BIOGEL PI INDICATOR 8 (GLOVE) ×1
GLOVE SURG SS PI 6.5 STRL IVOR (GLOVE) ×4 IMPLANT
GOWN STRL NON-REIN LRG LVL3 (GOWN DISPOSABLE) ×5 IMPLANT
GOWN STRL REIN XL XLG (GOWN DISPOSABLE) ×2 IMPLANT
HANDPIECE INTERPULSE COAX TIP (DISPOSABLE) ×2
IMMOBILIZER KNEE 20 (SOFTGOODS) ×2
IMMOBILIZER KNEE 20 THIGH 36 (SOFTGOODS) ×1 IMPLANT
KIT BASIN OR (CUSTOM PROCEDURE TRAY) ×2 IMPLANT
MANIFOLD NEPTUNE II (INSTRUMENTS) ×2 IMPLANT
NS IRRIG 1000ML POUR BTL (IV SOLUTION) ×2 IMPLANT
PACK TOTAL JOINT (CUSTOM PROCEDURE TRAY) ×2 IMPLANT
PADDING CAST COTTON 6X4 STRL (CAST SUPPLIES) ×6 IMPLANT
POSITIONER SURGICAL ARM (MISCELLANEOUS) ×2 IMPLANT
SET HNDPC FAN SPRY TIP SCT (DISPOSABLE) ×1 IMPLANT
SPONGE GAUZE 4X4 12PLY (GAUZE/BANDAGES/DRESSINGS) ×2 IMPLANT
STRIP CLOSURE SKIN 1/2X4 (GAUZE/BANDAGES/DRESSINGS) ×4 IMPLANT
SUCTION FRAZIER 12FR DISP (SUCTIONS) ×2 IMPLANT
SUT MNCRL AB 4-0 PS2 18 (SUTURE) ×2 IMPLANT
SUT VIC AB 2-0 CT1 27 (SUTURE) ×4
SUT VIC AB 2-0 CT1 TAPERPNT 27 (SUTURE) ×3 IMPLANT
SUT VLOC 180 0 24IN GS25 (SUTURE) ×2 IMPLANT
TOWEL OR 17X26 10 PK STRL BLUE (TOWEL DISPOSABLE) ×4 IMPLANT
TRAY FOLEY CATH 14FRSI W/METER (CATHETERS) ×2 IMPLANT
WATER STERILE IRR 1500ML POUR (IV SOLUTION) ×3 IMPLANT
WRAP KNEE MAXI GEL POST OP (GAUZE/BANDAGES/DRESSINGS) ×3 IMPLANT

## 2012-03-08 NOTE — Preoperative (Signed)
Beta Blockers   Reason not to administer Beta Blockers:Not Applicable, not on home BB 

## 2012-03-08 NOTE — Evaluation (Signed)
Physical Therapy Evaluation Patient Details Name: Bianca Williams MRN: [id number] DOB: [birth year] Today's Date: 03/08/2012 Time: 4098-1191 PT Time Calculation (min): 23 min  PT Assessment / Plan / Recommendation Clinical Impression  Pt presents s/p R TKA POD 0 with decreased strength, ROM and mobility.  Tolerated sitting EOB x approx 8-9 mins, with some dizziness and increasing nausea.  Assisted pt back into bed.  Pt will benefit from skilled PT in acute venue to address deficits.  PT recommends HHPT for follow up at D/C to maximize pt indepence.     PT Assessment  Patient needs continued PT services    Follow Up Recommendations  Home health PT    Does the patient have the potential to tolerate intense rehabilitation      Barriers to Discharge None      Equipment Recommendations  Rolling walker with 5" wheels;3 in 1 bedside comode    Recommendations for Other Services OT consult   Frequency 7X/week    Precautions / Restrictions Precautions Precautions: Knee Required Braces or Orthoses: Knee Immobilizer - Right Knee Immobilizer - Right: Discontinue once straight leg raise with < 10 degree lag Restrictions Weight Bearing Restrictions: No Other Position/Activity Restrictions: WBAT   Pertinent Vitals/Pain 5/10      Mobility  Bed Mobility Bed Mobility: Supine to Sit;Sitting - Scoot to Edge of Bed;Sit to Supine Supine to Sit: 4: Min assist;HOB elevated Sitting - Scoot to [place] of Bed: 4: Min assist Sit to Supine: 4: Min assist;HOB flat Details for Bed Mobility Assistance: Assist for RLE into and out of bed with cues for hand placement/technique.   Transfers Details for Transfer Assistance: Not assessed, pt nauseated upon sitting up to EOB>     Shoulder Instructions     Exercises     PT Diagnosis: Difficulty walking;Generalized weakness;Acute pain  PT Problem List: Decreased strength;Decreased range of motion;Decreased activity tolerance;Decreased balance;Decreased  mobility;Decreased knowledge of use of DME;Pain PT Treatment Interventions: DME instruction;Gait training;Stair training;Functional mobility training;Therapeutic activities;Therapeutic exercise;Balance training;Patient/family education   PT Goals Acute Rehab PT Goals PT Goal Formulation: With patient Time For Goal Achievement: 03/11/12 Potential to Achieve Goals: Good Pt will go Supine/Side to Sit: with supervision PT Goal: Supine/Side to Sit - Progress: Goal set today Pt will go Sit to Supine/Side: with supervision PT Goal: Sit to Supine/Side - Progress: Goal set today Pt will go Sit to Stand: with supervision PT Goal: Sit to Stand - Progress: Goal set today Pt will Ambulate: 51 - 150 feet;with supervision;with least restrictive assistive device PT Goal: Ambulate - Progress: Goal set today Pt will Go Up / Down Stairs: 3-5 stairs;with min assist;with least restrictive assistive device PT Goal: Up/Down Stairs - Progress: Goal set today Pt will Perform Home Exercise Program: with supervision, verbal cues required/provided PT Goal: Perform Home Exercise Program - Progress: Goal set today  Visit Information  Last PT Received On: 03/08/12 Assistance Needed: +2    Subjective Data  Subjective: I'll try and sit up.   Patient Stated Goal: to get the L knee replaced and get on with life.    Prior Functioning  Home Living Lives With: Spouse Available Help at Discharge: Family;Available 24 hours/day Type of Home: House Home Access: Stairs to enter [employer] of Steps: 5 Entrance Stairs-Rails: Right;Left Home Layout: Able to live on main level with bedroom/bathroom Bathroom Shower/Tub: Walk-in shower;Door [employer] Toilet: Standard Home Adaptive Equipment: Straight cane Prior Function Level of Independence: Independent Able to Take Stairs?: Yes Driving: Yes Communication Communication:  No difficulties    Cognition  Overall Cognitive Status: Appears within functional  limits for tasks assessed/performed Arousal/Alertness: Awake/alert Orientation Level: Appears intact for tasks assessed Behavior During Session: [facility] for tasks performed    Extremity/Trunk Assessment Right Lower Extremity Assessment RLE ROM/Strength/Tone: Deficits RLE ROM/Strength/Tone Deficits: ankle motions WFL, able to initiate SLR, however requires assist to lift against gravity.  RLE Sensation: WFL - Light Touch Left Lower Extremity Assessment LLE ROM/Strength/Tone: WFL for tasks assessed LLE Sensation: WFL - Light Touch Trunk Assessment Trunk Assessment: Normal   Balance Balance Balance Assessed: Yes Static Sitting Balance Static Sitting - Balance Support: Bilateral upper extremity supported;Feet supported Static Sitting - Level of Assistance: 5: Stand by assistance Static Sitting - Comment/# of Minutes: Pt sat EOB x approx 8-9 mins at stand by assist.  Pt states that she is somewhat dizzy, but more nauseated.    End of Session PT - End of Session Equipment Utilized During Treatment: Right knee immobilizer Activity Tolerance: Patient limited by pain;Other (comment) (dizziness/nausea) Patient left: in bed;with call bell/phone within reach;with family/visitor present Nurse Communication: Mobility status CPM Right Knee CPM Right Knee: Off  GP     Page, [name] 03/08/2012, 4:41 PM

## 2012-03-08 NOTE — H&P (View-Only) (Signed)
Bianca Williams  DOB: January 16, 1956 Married / Language: English / Race: White Female  Date of Admission:  03/08/12  Chief Complaint:  Right Knee Pain  History of Present Illness The patient is a 56 year old female who comes in for a preoperative History and Physical. The patient is scheduled for a right total knee arthroplasty to be performed by Dr. Gus Rankin. Aluisio, MD at [facility] on 03/08/2012. The patient is a 56 year old female who presents with knee complaints. The patient reports right knee symptoms including: pain (anterior), locking, stiffness and grinding (after prolonged standing) which began 5 year(s) ago without any known injury (Patient states that both knee have been hurting for awhile. She said that the right knee is worse. She tripped in March and said that her knee locked up. She saw Adolphus Birchwood and was told to follow up with Dr.Aluisio. She said after losing weight her knees felt better for awhile.). Prior to being seen today the patient was previously evaluated by a colleague. She states that the right knee hurts more than the left. She had knee problems for a long time but after she lost weight after the bypass surgery the knees did feel better. Unfortunately for the past nine months to one year she has had increased pain in the right knee. The knee occasionally will swell on her. It has given out at times. She is not having any lower extremity weakness or paresthesia with that. She is not having any hip pain or back pain with this. She would like to proceed with knee replacement surgery. They have been treated conservatively in the past for the above stated problem and despite conservative measures, they continue to have progressive pain and severe functional limitations and dysfunction. They have failed non-operative management including home exercise, medications, and injections. It is felt that they would benefit from undergoing total joint  replacement. Risks and benefits of the procedure have been discussed with the patient and they elect to proceed with surgery. There are no active contraindications to surgery such as ongoing infection or rapidly progressive neurological disease.   Problem List Osteoarthritis, Knee (715.96)   Allergies Naproxen Sodium *ANALGESICS - ANTI-INFLAMMATORY* VICODIN. 02/17/2008 KEFLEX. 02/17/2008 SULFA DRUGS. 02/17/2008 MORPHINE. 02/08/2010   Family History Heart disease in female family member before age 21 Osteoarthritis. mother Drug / Alcohol Addiction. brother Heart Disease. mother, brother and grandfather fathers side Heart disease in female family member before age 95 Cerebrovascular Accident. mother Cancer. mother and father   Social History Current work status. working full time Drug/Alcohol Rehab (Currently). no Exercise. Exercises weekly; does running / walking Alcohol use. current drinker; drinks beer and wine; 5-7 per week Tobacco use. never smoker Pain Contract. no Drug/Alcohol Rehab (Previously). no Illicit drug use. no Number of flights of stairs before winded. 2-3 Current occupation. office manager Living situation. live with spouse Marital status. married Post-Surgical Plans. Plan is to go home. Advance Directives. Living Will   Past Surgical History Fracture. left Cesarean Delivery. 1 time Breast Biopsy. left Gallbladder Surgery. laporoscopic Rotator Cuff Repair. left Other Orthopaedic Surgery Colon Polyp Removal - Colonoscopy Dilation and Curettage of Uterus   Past Medical History Osteoporosis Diabetes Mellitus, Type II. No longer on medications following gastric bypass Fibromyalgia Vertigo Anxiety Disorder Depression. Past History Impaired Vision. Wears glasses Hemorrhoids Menopause Measles Mumps Distal radius fracture, left (813.42)  Review of Systems General:Not Present- Chills, Fever, Night Sweats,  Fatigue, Weight Gain, Weight Loss and Memory Loss. Skin:Not Present-  Hives, Itching, Rash, Eczema and Lesions. HEENT:Not Present- Tinnitus, Headache, Double Vision, Visual Loss, Hearing Loss and Dentures. Respiratory:Not Present- Shortness of breath with exertion, Shortness of breath at rest, Allergies, Coughing up blood and Chronic Cough. Cardiovascular:Not Present- Chest Pain, Racing/skipping heartbeats, Difficulty Breathing Lying Down, Murmur, Swelling and Palpitations. Gastrointestinal:Not Present- Bloody Stool, Heartburn, Abdominal Pain, Vomiting, Nausea, Constipation, Diarrhea, Difficulty Swallowing, Jaundice and Loss of appetitie. Female Genitourinary:Not Present- Blood in Urine, Urinary frequency, Weak urinary stream, Discharge, Flank Pain, Incontinence, Painful Urination, Urgency, Urinary Retention and Urinating at Night. Musculoskeletal:Present- Morning Stiffness. Not Present- Muscle Weakness, Muscle Pain, Joint Swelling, Joint Pain, Back Pain and Spasms. Neurological:Not Present- Tremor, Dizziness, Blackout spells, Paralysis, Difficulty with balance and Weakness. Psychiatric:Not Present- Insomnia.   Vitals Weight: 121 lb Height: 61 in Body Surface Area: 1.54 m Body Mass Index: 22.86 kg/m Pulse: 60 (Regular) Resp.: 14 (Unlabored) BP: 122/78 (Sitting, Right Arm, Standard)    Physical Exam The physical exam findings are as follows:  Note: Patient is a 56 year old female with continued bilateral knee pain.   General Mental Status - Alert, cooperative and good historian. General Appearance- pleasant. Not in acute distress. Orientation- Oriented X3. Build & Nutrition- Well nourished and Well developed.   Head and Neck Head- normocephalic, atraumatic . Neck Global Assessment- supple. no bruit auscultated on the right and no bruit auscultated on the left.   Eye Pupil- Bilateral- Regular and Round. Note: wears glasses Motion- Bilateral-  EOMI.   Chest and Lung Exam Auscultation: Breath sounds:- clear at anterior chest wall and - clear at posterior chest wall. Adventitious sounds:- No Adventitious sounds.   Cardiovascular Auscultation:Rhythm- Regular rate and rhythm. Heart Sounds- S1/S2 normal Murmurs & Other Heart Sounds:Auscultation of the heart reveals - No Murmurs.   Abdomen Palpation/Percussion:Tenderness- Abdomen is non-tender to palpation. Rigidity (guarding)- Abdomen is soft. Auscultation:Auscultation of the abdomen reveals - Bowel sounds normal.   Female Genitourinary Not done, not pertinent to present illness  Musculoskeletal Very pleasant, well developed female alert and oriented in no apparent distress. Evaluation of her hips show normal range of motion with no discomfort. Her left knee shows no effusion. Slight varus deformity. Marked crepitus on range of motion. She is slightly tender medial greater than lateral, no instability. Right knee no effusion. She is tender along the medial aspect of the knee. She has a varus deformity. Range about 10 to 120. There is no lateral tenderness or instability noted. Pulses, sensation and motor are intact both lower extremities.  RADIOGRAPHS: AP both knees and lateral show endstage arthritic change of both knees, right worse than the left. She has bone on bone in the medial and patellofemoral compartments of both knees. She has tibial subluxation worse on the right than the left.  Assessment & Plan Osteoarthritis, Knee (715.96) Impression: Right Knee  Note: Patient is for a right total knee replacement by Dr. Lequita Halt.  Plan is to go home.  PCP - Dr. Maris Berger  Signed electronically by Roberts Gaudy, PA-C

## 2012-03-08 NOTE — Anesthesia Postprocedure Evaluation (Signed)
  Anesthesia Post-op Note  Patient: Bianca Williams  Procedure(s) Performed: Procedure(s) (LRB): TOTAL KNEE ARTHROPLASTY (Right)  Patient Location: PACU  Anesthesia Type: General  Level of Consciousness: awake and alert   Airway and Oxygen Therapy: Patient Spontanous Breathing  Post-op Pain: mild  Post-op Assessment: Post-op Vital signs reviewed, Patient's Cardiovascular Status Stable, Respiratory Function Stable, Patent Airway and No signs of Nausea or vomiting  Post-op Vital Signs: stable  Complications: No apparent anesthesia complications

## 2012-03-08 NOTE — Interval H&P Note (Signed)
History and Physical Interval Note:  03/08/2012 6:51 AM  Bianca Williams  has presented today for surgery, with the diagnosis of Osteoarthritis of the Right Knee  The various methods of treatment have been discussed with the patient and family. After consideration of risks, benefits and other options for treatment, the patient has consented to  Procedure(s) (LRB) with comments: TOTAL KNEE ARTHROPLASTY (Right) as a surgical intervention .  The patient's history has been reviewed, patient examined, no change in status, stable for surgery.  I have reviewed the patient's chart and labs.  Questions were answered to the patient's satisfaction.     Loanne Drilling

## 2012-03-08 NOTE — Op Note (Signed)
Pre-operative diagnosis- Osteoarthritis  Right knee(s)  Post-operative diagnosis- Osteoarthritis Right knee(s)  Procedure-  Right  Total Knee Arthroplasty  Surgeon- Gus Rankin. Syd Newsome, MD  Assistant- Dimitri Ped, PA-C   Anesthesia-  General EBL-* No blood loss amount entered *  Drains Hemovac  Tourniquet time-  Total Tourniquet Time Documented: Thigh (Right) - 29 minutes   Complications- None  Condition-PACU - hemodynamically stable.   Brief Clinical Note  Bianca Williams is a 56 y.o. year old female with end stage OA of her right knee with progressively worsening pain and dysfunction. She has constant pain, with activity and at rest and significant functional deficits with difficulties even with ADLs. She has had extensive non-op management including analgesics, injections of cortisone, and home exercise program, but remains in significant pain with significant dysfunction. Radiographs show bone on bone arthritis. She presents now for right Total Knee Arthroplasty.     Procedure in detail---   The patient is brought into the operating room and positioned supine on the operating table. After successful administration of  General,   a tourniquet is placed high on the  Right thigh(s) and the lower extremity is prepped and draped in the usual sterile fashion. Time out is performed by the operating team and then the  Right lower extremity is wrapped in Esmarch, knee flexed and the tourniquet inflated to 300 mmHg.       A midline incision is made with a ten blade through the subcutaneous tissue to the level of the extensor mechanism. A fresh blade is used to make a medial parapatellar arthrotomy. Soft tissue over the proximal medial tibia is subperiosteally elevated to the joint line with a knife and into the semimembranosus bursa with a Cobb elevator. Soft tissue over the proximal lateral tibia is elevated with attention being paid to avoiding the patellar tendon on the tibial tubercle.  The patella is everted, knee flexed 90 degrees and the ACL and PCL are removed. Findings are bone on bone medial and patellofemoral with large medial osteophytes.        The drill is used to create a starting hole in the distal femur and the canal is thoroughly irrigated with sterile saline to remove the fatty contents. The 5 degree Right  valgus alignment guide is placed into the femoral canal and the distal femoral cutting block is pinned to remove 10 mm off the distal femur. Resection is made with an oscillating saw.      The tibia is subluxed forward and the menisci are removed. The extramedullary alignment guide is placed referencing proximally at the medial aspect of the tibial tubercle and distally along the second metatarsal axis and tibial crest. The block is pinned to remove 2mm off the more deficient medial  side. Resection is made with an oscillating saw. Size 2.5is the most appropriate size for the tibia and the proximal tibia is prepared with the modular drill and keel punch for that size.      The femoral sizing guide is placed and size 2.5 is most appropriate. Rotation is marked off the epicondylar axis and confirmed by creating a rectangular flexion gap at 90 degrees. The size 2.5 cutting block is pinned in this rotation and the anterior, posterior and chamfer cuts are made with the oscillating saw. The intercondylar block is then placed and that cut is made.      Trial size 2.5 tibial component, trial size 2.5 posterior stabilized femur and a 10  mm posterior stabilized rotating  platform insert trial is placed. Full extension is achieved with excellent varus/valgus and anterior/posterior balance throughout full range of motion. The patella is everted and thickness measured to be 20  mm. Free hand resection is taken to 11 mm, a 35 template is placed, lug holes are drilled, trial patella is placed, and it tracks normally. Osteophytes are removed off the posterior femur with the trial in place.  All trials are removed and the cut bone surfaces prepared with pulsatile lavage. Cement is mixed and once ready for implantation, the size 2.5 tibial implant, size  2.5 posterior stabilized femoral component, and the size 35 patella are cemented in place and the patella is held with the clamp. The trial insert is placed and the knee held in full extension. All extruded cement is removed and once the cement is hard the permanent 10 mm posterior stabilized rotating platform insert is placed into the tibial tray.      The wound is copiously irrigated with saline solution and the extensor mechanism closed over a hemovac drain with #1 PDS suture. The tourniquet is released for a total tourniquet time of 29  minutes. Flexion against gravity is 140 degrees and the patella tracks normally. Subcutaneous tissue is closed with 2.0 vicryl and subcuticular with running 4.0 Monocryl. The catheter for the Marcaine pain pump is placed and the pump is initiated. The incision is cleaned and dried and steri-strips and a bulky sterile dressing are applied. The limb is placed into a knee immobilizer and the patient is awakened and transported to recovery in stable condition.      Please note that a surgical assistant was a medical necessity for this procedure in order to perform it in a safe and expeditious manner. Surgical assistant was necessary to retract the ligaments and vital neurovascular structures to prevent injury to them and also necessary for proper positioning of the limb to allow for anatomic placement of the prosthesis.   Gus Rankin Alanmichael Barmore, MD    03/08/2012, 8:17 AM

## 2012-03-08 NOTE — Transfer of Care (Signed)
Immediate Anesthesia Transfer of Care Note  Patient: Bianca Williams [facility]  Procedure(s) Performed: Procedure(s) (LRB) with comments: TOTAL KNEE ARTHROPLASTY (Right)  Patient Location: PACU  Anesthesia Type: General  Level of Consciousness: awake, patient cooperative and responds to stimulation  Airway & Oxygen Therapy: Patient Spontanous Breathing and Patient connected to face mask oxygen  Post-op Assessment: Report given to PACU RN, Post -op Vital signs reviewed and stable and Patient moving all extremities  Post vital signs: Reviewed and stable  Complications: No apparent anesthesia complications

## 2012-03-08 NOTE — Plan of Care (Signed)
Problem: Consults Goal: Diagnosis- Total Joint Replacement Primary Total Knee     

## 2012-03-09 ENCOUNTER — Encounter (HOSPITAL_COMMUNITY): Payer: Self-pay | Admitting: Orthopedic Surgery

## 2012-03-09 DIAGNOSIS — D62 Acute posthemorrhagic anemia: Secondary | ICD-10-CM

## 2012-03-09 DIAGNOSIS — M79609 Pain in unspecified limb: Secondary | ICD-10-CM

## 2012-03-09 HISTORY — DX: Acute posthemorrhagic anemia: D62

## 2012-03-09 LAB — BASIC METABOLIC PANEL
BUN: 12 mg/dL (ref 6–23)
CO2: 27 mEq/L (ref 19–32)
Calcium: 8.3 mg/dL — ABNORMAL LOW (ref 8.4–10.5)
Chloride: 102 mEq/L (ref 96–112)
Creatinine, Ser: 0.57 mg/dL (ref 0.50–1.10)
Glucose, Bld: 136 mg/dL — ABNORMAL HIGH (ref 70–99)

## 2012-03-09 LAB — CBC
HCT: 24.8 % — ABNORMAL LOW (ref 36.0–46.0)
MCH: 32.1 pg (ref 26.0–34.0)
MCV: 90.5 fL (ref 78.0–100.0)
RBC: 2.74 MIL/uL — ABNORMAL LOW (ref 3.87–5.11)
WBC: 11.4 10*3/uL — ABNORMAL HIGH (ref 4.0–10.5)

## 2012-03-09 MED ORDER — POLYSACCHARIDE IRON COMPLEX 150 MG PO CAPS
150.0000 mg | ORAL_CAPSULE | Freq: Every day | ORAL | Status: DC
  Administered 2012-03-10: 150 mg via ORAL
  Filled 2012-03-09 (×3): qty 1

## 2012-03-09 MED ORDER — HYDROMORPHONE HCL PF 1 MG/ML IJ SOLN
0.5000 mg | INTRAMUSCULAR | Status: DC | PRN
  Administered 2012-03-09 (×3): 1 mg via INTRAVENOUS
  Filled 2012-03-09 (×3): qty 1

## 2012-03-09 NOTE — Evaluation (Signed)
Occupational Therapy Evaluation Patient Details Name: NOELLA OCEJO MRN: [id number] DOB: 03-14-1956 Today's Date: 03/09/2012 Time: 4098-1191 OT Time Calculation (min): 17 min  OT Assessment / Plan / Recommendation Clinical Impression  This 56 year old female was admitted for L TKA.  She recently fx'd R wrist and we are using a platform walker.  Eval was limited by nausea.  Will follow in acute to continue education and to recommend needed DME.    OT Assessment  Patient needs continued OT Services    Follow Up Recommendations  No OT follow up    Barriers to Discharge      Equipment Recommendations  Rolling walker with 5" wheels;3 in 1 bedside comode (to be further assessed by OT)    Recommendations for Other Services    Frequency  Min 2X/week    Precautions / Restrictions Precautions Precautions: Knee Precaution Comments: Pt with old L wrist fx (happened early Aug), therefore used L platform walker to ensure safety of wrist.  Required Braces or Orthoses: Knee Immobilizer - Right Knee Immobilizer - Right: Discontinue once straight leg raise with < 10 degree lag Restrictions Weight Bearing Restrictions: No Other Position/Activity Restrictions: WBAT   Pertinent Vitals/Pain 5/10 L knee; repositioned with ice.  Pt had nausea--better after repositioning    ADL  Grooming: Simulated;Set up Where Assessed - Grooming: Unsupported sitting Upper Body Bathing: Simulated;Set up Where Assessed - Upper Body Bathing: Unsupported sitting Lower Body Bathing: Simulated;Moderate assistance Where Assessed - Lower Body Bathing: Supported sit to stand Upper Body Dressing: Simulated;Minimal assistance (lines) Where Assessed - Upper Body Dressing: Unsupported sitting Lower Body Dressing: Simulated;Maximal assistance Where Assessed - Lower Body Dressing: Supported sit to stand Toilet Transfer: Mining engineer Method: Sit to Barista:  (bed  ambulate and back to bed) Toileting - Clothing Manipulation and Hygiene: Simulated;Moderate assistance Where Assessed - Toileting Clothing Manipulation and Hygiene: Sit to stand from 3-in-1 or toilet Transfers/Ambulation Related to ADLs: cotx with PT.  pt felt nauseaus when she started moving ADL Comments: Did not ask pt if she was interested in AE.  She felt bad as eval progressed    OT Diagnosis: Generalized weakness  OT Problem List: Decreased activity tolerance;Pain;Decreased knowledge of use of DME or AE;Decreased strength OT Treatment Interventions: Self-care/ADL training;DME and/or AE instruction;Patient/family education   OT Goals Acute Rehab OT Goals OT Goal Formulation: With patient Time For Goal Achievement: 03/16/12 Potential to Achieve Goals: Good ADL Goals Pt Will Transfer to Toilet: with supervision;Ambulation;Regular height toilet (vs DME over commode, prn) ADL Goal: Toilet Transfer - Progress: Goal set today Pt Will Perform Toileting - Hygiene: with supervision;Sit to stand from 3-in-1/toilet ADL Goal: Toileting - Hygiene - Progress: Goal set today Pt Will Perform Tub/Shower Transfer: Shower transfer;Ambulation;with min assist (min guard) ADL Goal: Tub/Shower Transfer - Progress: Goal set today Miscellaneous OT Goals Miscellaneous OT Goal #1: pt will verbalize AE use for adls OT Goal: Miscellaneous Goal #1 - Progress: Goal set today  Visit Information  Last OT Received On: 03/09/12 Assistance Needed: +1    Subjective Data  Subjective: I'm sorry I just feel nauseaus   Prior Functioning     Home Living Bathroom Shower/Tub: Walk-in shower;Door [employer]: Standard Additional Comments: tub is next to whirlpool tub; told [employer] she doesn't want 3:1 Prior Function Level of Independence: Independent Communication Communication: No difficulties         Vision/Perception     Cognition  Overall Cognitive Status: Appears within functional  limits for  tasks assessed/performed Arousal/Alertness: Awake/alert Orientation Level: Appears intact for tasks assessed Behavior During Session: [facility] for tasks performed    Extremity/Trunk Assessment Right Upper Extremity Assessment RUE ROM/Strength/Tone: Within functional levels (feels she messed up shoulder in car) Left Upper Extremity Assessment LUE ROM/Strength/Tone: Deficits (recent wrist fx:  using platform walker)     Mobility Bed Mobility Bed Mobility: Supine to Sit;Sit to Supine Supine to Sit: 4: Min assist;HOB elevated Sitting - Scoot to Edge of Bed: 4: Min assist Sit to Supine: 4: Min assist;HOB flat Details for Bed Mobility Assistance: Assist for RLE out of bed and to ensure safety when scooting to EOB.  Cues for hand placement/technique.  Transfers Sit to Stand: 4: Min assist Stand to Sit: To bed;4: Min assist Details for Transfer Assistance: Assist to rise and steady with cues for hand placement, LE management and safety when sitting/standing.      Shoulder Instructions     Exercise     Balance     End of Session OT - End of Session Activity Tolerance: Other (comment) (limited by nausea)  GO     Jaeger Trueheart 03/09/2012, 5:08 PM Marica Otter, OTR/L [phone] 03/09/2012

## 2012-03-09 NOTE — Progress Notes (Signed)
*  Preliminary Results* Left lower extremity venous duplex completed. Left lower extremity is negative for deep vein thrombosis. Preliminary results discussed with Bonita Quin, RN.  03/09/2012 3:51 PM Gertie Fey, RDMS, RDCS

## 2012-03-09 NOTE — Progress Notes (Signed)
   Subjective: 1 Day Post-Op Procedure(s) (LRB): TOTAL KNEE ARTHROPLASTY (Right) Patient reports pain as mild and moderate.   Patient seen in rounds with Dr. Lequita Halt. Patient is having problems with pain in the knee, requiring pain medications We will start therapy today.  Plan is to go Home after hospital stay.  Objective: Vital signs in last 24 hours: Temp:  [97.8 F (36.6 C)-99 F (37.2 C)] 99 F (37.2 C) (10/15 0628) Pulse Rate:  [57-97] 64  (10/15 0628) Resp:  [9-18] 15  (10/15 0840) BP: (103-167)/(57-86) 134/70 mmHg (10/15 0628) SpO2:  [94 %-100 %] 100 % (10/15 0840) Weight:  [55.339 kg (122 lb)] 55.339 kg (122 lb) (10/14 1200)  Intake/Output from previous day:  Intake/Output Summary (Last 24 hours) at 03/09/12 0853 Last data filed at 03/09/12 0619  Gross per 24 hour  Intake 2063.75 ml  Output   1770 ml  Net 293.75 ml    Intake/Output this shift: UOP 800 since MN  Labs:  Basename 03/09/12 0400  HGB 8.8*    Basename 03/09/12 0400  WBC 11.4*  RBC 2.74*  HCT 24.8*  PLT 144*    Basename 03/09/12 0400  NA 137  K 3.8  CL 102  CO2 27  BUN 12  CREATININE 0.57  GLUCOSE 136*  CALCIUM 8.3*   No results found for this basename: LABPT:2,INR:2 in the last 72 hours  EXAM General - Patient is Alert, Appropriate and Oriented Extremity - Neurovascular intact Sensation intact distally Dorsiflexion/Plantar flexion intact Dressing - moderate drainage,  Dressing changed.  Incision - looks good. Motor Function - intact, moving foot and toes well on exam.  Hemovac pulled without difficulty.  Past Medical History  Diagnosis Date  . Fractured     WRIST 12/26/2011 --CAST HAS BEEN REMOVED--STILL SORE AND HEALING  . Osteoporosis   . Fibromyalgia   . Vertigo     HX OF MILD EPISODES  . Anxiety   . Depression     RESOLVED  . Hemorrhoids   . Arthritis   . Memory problem     THOUGHT TO BE RELATED TO LOW VITAMIN B--NOW ON SUPPLEMENT AND HAS NOTICED IMPROVEMENT  .  Balance problem     PT ATTRIBUTES TO HER KNEE ARTHRITIS-BONE ON BONE    Assessment/Plan: 1 Day Post-Op Procedure(s) (LRB): TOTAL KNEE ARTHROPLASTY (Right) Principal Problem:  *OA (osteoarthritis) of knee Active Problems:  Postop Acute blood loss anemia  Estimated Body mass index is 23.05 kg/(m^2) as calculated from the following:   Height as of this encounter: 5\' 1" (1.549 m).   Weight as of this encounter: 122 lb(55.339 kg). Advance diet Up with therapy Discharge home with home health  DVT Prophylaxis - Xarelto Weight-Bearing as tolerated to right leg No vaccines. D/C PCA Diluadid, Change to IV push D/C O2 and Pulse OX and try on Room Air  Tanashia Ciesla, Marlowe Sax 03/09/2012, 8:53 AM

## 2012-03-09 NOTE — Progress Notes (Signed)
Physical Therapy Treatment Patient Details Name: Bianca Williams MRN: [id number] DOB: Jun 14, 1955 Today's Date: 03/09/2012 Time: 7829-5621 PT Time Calculation (min): 33 min  PT Assessment / Plan / Recommendation Comments on Treatment Session  Pt continues to have increased nausea/dizziness with sitting on EOB, therefore sat EOB x approx 10 mins before attempting to stand.  Noted that pts HGB is 8.8.  RN notified.     Follow Up Recommendations  Home health PT     Does the patient have the potential to tolerate intense rehabilitation     Barriers to Discharge        Equipment Recommendations  Rolling walker with 5" wheels;3 in 1 bedside comode (with left platform)    Recommendations for Other Services OT consult  Frequency 7X/week   Plan Discharge plan remains appropriate    Precautions / Restrictions Precautions Precautions: Knee Precaution Comments: Pt with old L wrist fx (happened early Aug), therefore used L platform walker to ensure safety of wrist.  Required Braces or Orthoses: Knee Immobilizer - Right Knee Immobilizer - Right: Discontinue once straight leg raise with < 10 degree lag Restrictions Weight Bearing Restrictions: No Other Position/Activity Restrictions: WBAT   Pertinent Vitals/Pain 5/10, ice packs applied    Mobility  Bed Mobility Bed Mobility: Supine to Sit;Sitting - Scoot to Edge of Bed Supine to Sit: 4: Min assist;HOB elevated Sitting - Scoot to [place] of Bed: 4: Min assist Details for Bed Mobility Assistance: Assist for RLE out of bed and to ensure safety when scooting to EOB.  Cues for hand placement/technique.  Transfers Transfers: Sit to Stand;Stand to Sit Sit to Stand: 4: Min assist;3: Mod assist;From elevated surface;With upper extremity assist;From bed Stand to Sit: 4: Min assist;With upper extremity assist;With armrests;To chair/3-in-1 Details for Transfer Assistance: Assist to rise and steady with cues for hand placement, LE management and  safety when sitting/standing.  Ambulation/Gait Ambulation/Gait Assistance: 1: +2 Total assist Ambulation/Gait: Patient Percentage: 60% Ambulation Distance (Feet): 12 Feet Assistive device: Left platform walker Ambulation/Gait Assistance Details: Cues for sequencing/technique with RW, equal step length and to maintain upright posture.  Gait Pattern: Step-to pattern;Decreased stance time - right;Decreased step length - left;Antalgic;Trunk flexed    Exercises     PT Diagnosis:    PT Problem List:   PT Treatment Interventions:     PT Goals Acute Rehab PT Goals PT Goal Formulation: With patient Time For Goal Achievement: 03/11/12 Potential to Achieve Goals: Good Pt will go Supine/Side to Sit: with supervision PT Goal: Supine/Side to Sit - Progress: Progressing toward goal Pt will go Sit to Stand: with supervision PT Goal: Sit to Stand - Progress: Progressing toward goal Pt will Ambulate: 51 - 150 feet;with supervision;with least restrictive assistive device PT Goal: Ambulate - Progress: Progressing toward goal  Visit Information  Last PT Received On: 03/09/12 Assistance Needed: +2 (for dizziness/nausea)    Subjective Data  Subjective: Ill try to get up.  Patient Stated Goal: to get the L knee replaced and get on with life.    Cognition  Overall Cognitive Status: Appears within functional limits for tasks assessed/performed Arousal/Alertness: Awake/alert Orientation Level: Appears intact for tasks assessed Behavior During Session: [facility] for tasks performed Cognition - Other Comments: Pt very concerned about being dizzy/nausous.  Educated pt that this is normal following surgery.     Balance     End of Session PT - End of Session Equipment Utilized During Treatment: Right knee immobilizer Activity Tolerance: Patient limited by fatigue;Patient limited by  pain (Nausea) Patient left: in chair;with call bell/phone within reach Nurse Communication: Mobility status CPM Right  Knee CPM Right Knee: Off   GP     [name], [name] 03/09/2012, 9:55 AM

## 2012-03-09 NOTE — Progress Notes (Signed)
PT Cancellation Note for pm Tx  ___Treatment cancelled today due to medical issues with patient which prohibited therapy  _X_ Treatment cancelled today due to patient receiving procedure or test .........Marland KitchenDoppler to r/o DVT R LE  ___ Treatment cancelled today due to patient's refusal to participate   ___ Treatment cancelled today due to  Felecia Shelling  PTA [facility]  Acute  Rehab Pager     [phone]

## 2012-03-09 NOTE — Progress Notes (Signed)
Physical Therapy Treatment Patient Details Name: Bianca Williams MRN: [id number] DOB: 1956-04-10 Today's Date: 03/09/2012 Time: 4098-1191 PT Time Calculation (min): 24 min  PT Assessment / Plan / Recommendation Comments on Treatment Session  Pt continues ltd by nausea but motivated to progress    Follow Up Recommendations  Home health PT     Does the patient have the potential to tolerate intense rehabilitation     Barriers to Discharge        Equipment Recommendations  Rolling walker with 5" wheels;3 in 1 bedside comode    Recommendations for Other Services OT consult  Frequency 7X/week   Plan Discharge plan remains appropriate    Precautions / Restrictions Precautions Precautions: Knee Precaution Comments: Pt with old L wrist fx (happened early Aug), therefore used L platform walker to ensure safety of wrist.  Required Braces or Orthoses: Knee Immobilizer - Right Knee Immobilizer - Right: Discontinue once straight leg raise with < 10 degree lag Restrictions Weight Bearing Restrictions: No Other Position/Activity Restrictions: WBAT   Pertinent Vitals/Pain 5/10    Mobility  Bed Mobility Bed Mobility: Supine to Sit;Sit to Supine Supine to Sit: 4: Min assist;HOB elevated Sitting - Scoot to Edge of Bed: 4: Min assist Sit to Supine: 4: Min assist;HOB flat Details for Bed Mobility Assistance: Assist for RLE out of bed and to ensure safety when scooting to EOB.  Cues for hand placement/technique.  Transfers Transfers: Sit to Stand;Stand to Sit Sit to Stand: 4: Min assist Stand to Sit: To bed;4: Min assist Details for Transfer Assistance: Assist to rise and steady with cues for hand placement, LE management and safety when sitting/standing.  Ambulation/Gait Ambulation/Gait Assistance: 4: Min assist Ambulation Distance (Feet): 46 Feet Assistive device: Left platform walker Ambulation/Gait Assistance Details: cues for sequence, stride length and position from RW Gait  Pattern: Step-to pattern;Antalgic;Trunk flexed;Decreased stance time - right;Decreased step length - left General Gait Details: multiple short rests 2* nausea    Exercises     PT Diagnosis:    PT Problem List:   PT Treatment Interventions:     PT Goals Acute Rehab PT Goals PT Goal Formulation: With patient Time For Goal Achievement: 03/11/12 Potential to Achieve Goals: Good Pt will go Supine/Side to Sit: with supervision PT Goal: Supine/Side to Sit - Progress: Progressing toward goal Pt will go Sit to Supine/Side: with supervision PT Goal: Sit to Supine/Side - Progress: Progressing toward goal Pt will go Sit to Stand: with supervision PT Goal: Sit to Stand - Progress: Progressing toward goal Pt will Ambulate: 51 - 150 feet;with supervision;with least restrictive assistive device PT Goal: Ambulate - Progress: Progressing toward goal Pt will Go Up / Down Stairs: 3-5 stairs;with min assist;with least restrictive assistive device PT Goal: Up/Down Stairs - Progress: Goal set today  Visit Information  Last PT Received On: 03/09/12 Assistance Needed: +1    Subjective Data  Subjective: I'm still a little nauseous but I want to try to move Patient Stated Goal: to get the L knee replaced and get on with life.    Cognition  Overall Cognitive Status: Appears within functional limits for tasks assessed/performed Arousal/Alertness: Awake/alert Orientation Level: Appears intact for tasks assessed Behavior During Session: [facility] for tasks performed    Balance     End of Session PT - End of Session Equipment Utilized During Treatment: Right knee immobilizer Activity Tolerance: Patient limited by fatigue;Other (comment) (and nausea) Patient left: in bed;with call bell/phone within reach;with family/visitor present Nurse Communication: Mobility  status   GP     Bianca Williams 03/09/2012, 4:55 PM

## 2012-03-10 DIAGNOSIS — E876 Hypokalemia: Secondary | ICD-10-CM | POA: Diagnosis not present

## 2012-03-10 HISTORY — DX: Hypokalemia: E87.6

## 2012-03-10 LAB — CBC
HCT: 24.5 % — ABNORMAL LOW (ref 36.0–46.0)
MCH: 32.1 pg (ref 26.0–34.0)
MCHC: 34.7 g/dL (ref 30.0–36.0)
MCV: 92.5 fL (ref 78.0–100.0)
RDW: 12.4 % (ref 11.5–15.5)

## 2012-03-10 LAB — BASIC METABOLIC PANEL
BUN: 7 mg/dL (ref 6–23)
Calcium: 8.2 mg/dL — ABNORMAL LOW (ref 8.4–10.5)
Chloride: 101 mEq/L (ref 96–112)
Creatinine, Ser: 0.53 mg/dL (ref 0.50–1.10)
GFR calc Af Amer: >90 mL/min (ref >90)
GFR calc non Af Amer: >90 mL/min (ref >90)

## 2012-03-10 MED ORDER — HYDROMORPHONE HCL 2 MG PO TABS: 2.0000 mg | ORAL_TABLET | ORAL | Status: DC | PRN

## 2012-03-10 MED ORDER — POLYSACCHARIDE IRON COMPLEX 150 MG PO CAPS: 150.0000 mg | ORAL_CAPSULE | Freq: Every day | ORAL | Status: DC

## 2012-03-10 MED ORDER — RIVAROXABAN 10 MG PO TABS: 10.0000 mg | ORAL_TABLET | Freq: Every day | ORAL | Status: DC

## 2012-03-10 MED ORDER — METHOCARBAMOL 500 MG PO TABS: 500.0000 mg | ORAL_TABLET | Freq: Four times a day (QID) | ORAL | Status: DC | PRN

## 2012-03-10 NOTE — Progress Notes (Signed)
Physical Therapy Treatment Patient Details Name: Bianca Williams MRN: [id number] DOB: 1955/07/04 Today's Date: 03/10/2012 Time: 1342-1410 PT Time Calculation (min): 28 min  PT Assessment / Plan / Recommendation Comments on Treatment Session  Pt slowly progressing with mobility.  She did increase ambulation distance and tolerated stair training today.  Continue to recommend that she stay one more day for some more PT.      Follow Up Recommendations  Home health PT     Does the patient have the potential to tolerate intense rehabilitation     Barriers to Discharge        Equipment Recommendations  Other (comment);3 in 1 bedside comode;Rolling walker with 5" wheels    Recommendations for Other Services    Frequency 7X/week   Plan Discharge plan remains appropriate    Precautions / Restrictions Precautions Precautions: Knee Precaution Comments: L PFRW due to old wrist fx. Required Braces or Orthoses: Knee Immobilizer - Right Knee Immobilizer - Right: Discontinue once straight leg raise with < 10 degree lag Restrictions Weight Bearing Restrictions: No Other Position/Activity Restrictions: WBAT   Pertinent Vitals/Pain 7/10    Mobility  Bed Mobility Bed Mobility: Not assessed (Pt in recliner when PT arrived. ) Transfers Transfers: Sit to Stand;Stand to Sit Sit to Stand: 4: Min assist;With upper extremity assist;With armrests;From chair/3-in-1 Stand to Sit: 4: Min assist;With upper extremity assist;With armrests;To chair/3-in-1 Details for Transfer Assistance: Assist to rise and steady with cues for hand placement and LE management.  Ambulation/Gait Ambulation/Gait Assistance: 4: Min assist Ambulation Distance (Feet): 80 Feet Assistive device: Left platform walker Ambulation/Gait Assistance Details: Cues for sequencing/technique with RW.  Noted that pt is moving somewhat better and also increased distance this afternoon.  Gait Pattern: Step-to pattern;Antalgic;Trunk  flexed;Decreased stance time - right;Decreased step length - left Stairs: Yes Stairs Assistance: 3: Mod assist Stairs Assistance Details (indicate cue type and reason): Cues for sequencing/technique.  Had pt use one handrail on R and educated husband to be "platform" on L side while going up steps.  Pt stated that she may like to practice them again.   Stair Management Technique: One rail Right;Step to pattern;Forwards (with husband on L side) Number of Stairs: 4     Exercises     PT Diagnosis:    PT Problem List:   PT Treatment Interventions:     PT Goals Acute Rehab PT Goals PT Goal Formulation: With patient Time For Goal Achievement: 03/11/12 Potential to Achieve Goals: Good Pt will go Sit to Stand: with supervision PT Goal: Sit to Stand - Progress: Progressing toward goal Pt will Ambulate: 51 - 150 feet;with supervision;with least restrictive assistive device PT Goal: Ambulate - Progress: Progressing toward goal Pt will Go Up / Down Stairs: 3-5 stairs;with min assist;with least restrictive assistive device PT Goal: Up/Down Stairs - Progress: Progressing toward goal  Visit Information  Last PT Received On: 03/10/12 Assistance Needed: +1    Subjective Data  Subjective: Its going to be a lot of work to do the stairs isn't it.  Patient Stated Goal: to get the L knee replaced and get on with life.    Cognition  Overall Cognitive Status: Appears within functional limits for tasks assessed/performed Arousal/Alertness: Awake/alert Orientation Level: Appears intact for tasks assessed Behavior During Session: [facility] for tasks performed    Balance     End of Session PT - End of Session Equipment Utilized During Treatment: Right knee immobilizer Activity Tolerance: Patient limited by fatigue;Other (comment) Patient left: in  chair;with call bell/phone within reach Nurse Communication: Mobility status   GP     Page, [name] 03/10/2012, 3:52 PM

## 2012-03-10 NOTE — Progress Notes (Signed)
Physical Therapy Treatment Patient Details Name: Bianca Williams MRN: [id number] DOB: 04-19-1956 Today's Date: 03/10/2012 Time: 4098-1191 PT Time Calculation (min): 32 min  PT Assessment / Plan / Recommendation Comments on Treatment Session  Pt continues to be somewhat limited by nausea and increased pain.  Mobility seems somewhat improved, however would recommend a little more PT before D/C home.     Follow Up Recommendations  Home health PT     Does the patient have the potential to tolerate intense rehabilitation     Barriers to Discharge        Equipment Recommendations  Rolling walker with 5" wheels;3 in 1 bedside comode (with Left platform.)    Recommendations for Other Services    Frequency 7X/week   Plan Discharge plan remains appropriate    Precautions / Restrictions Precautions Precautions: Knee Precaution Comments: Pt with old L wrist fx (happened early Aug), therefore used L platform walker to ensure safety of wrist.  Required Braces or Orthoses: Knee Immobilizer - Right Knee Immobilizer - Right: Discontinue once straight leg raise with < 10 degree lag Restrictions Weight Bearing Restrictions: No Other Position/Activity Restrictions: WBAT   Pertinent Vitals/Pain 6/10 pain, burning sensation. Ice packs applied    Mobility  Bed Mobility Bed Mobility: Supine to Sit;Sitting - Scoot to Edge of Bed Supine to Sit: 4: Min assist;HOB elevated;With rails Sitting - Scoot to Edge of Bed: 4: Min guard Details for Bed Mobility Assistance: Assist for RLE out of bed with cues for hand placement/technique.  Transfers Transfers: Sit to Stand;Stand to Sit Sit to Stand: 4: Min guard;From elevated surface;With upper extremity assist;From bed Stand to Sit: 4: Min guard;With upper extremity assist;With armrests;To chair/3-in-1 Details for Transfer Assistance: Performed x 2 in order to use 3in1 in restroom.  Cues for hand placement/LE management.    Ambulation/Gait Ambulation/Gait Assistance: 4: Min assist Ambulation Distance (Feet): 15 Feet (then another 15') Assistive device: Left platform walker Ambulation/Gait Assistance Details: Min cues for sequencing/technique with RW and negotiating RW in bathroom for increased safety.  Gait Pattern: Step-to pattern;Antalgic;Trunk flexed;Decreased stance time - right;Decreased step length - left    Exercises     PT Diagnosis:    PT Problem List:   PT Treatment Interventions:     PT Goals Acute Rehab PT Goals PT Goal Formulation: With patient Time For Goal Achievement: 03/11/12 Potential to Achieve Goals: Good Pt will go Supine/Side to Sit: with supervision PT Goal: Supine/Side to Sit - Progress: Progressing toward goal Pt will go Sit to Stand: with supervision PT Goal: Sit to Stand - Progress: Progressing toward goal Pt will Ambulate: 51 - 150 feet;with supervision;with least restrictive assistive device PT Goal: Ambulate - Progress: Progressing toward goal  Visit Information  Last PT Received On: 03/10/12 Assistance Needed: +1    Subjective Data  Subjective: I just want to get over this nausea.  Patient Stated Goal: to get the L knee replaced and get on with life.    Cognition  Overall Cognitive Status: Appears within functional limits for tasks assessed/performed Arousal/Alertness: Awake/alert Orientation Level: Appears intact for tasks assessed Behavior During Session: [facility] for tasks performed    Balance     End of Session PT - End of Session Equipment Utilized During Treatment: Right knee immobilizer Activity Tolerance: Patient limited by fatigue;Other (comment) (nausea) Patient left: in chair;with call bell/phone within reach Nurse Communication: Mobility status CPM Right Knee CPM Right Knee: Off   GP     Page, [name] 03/10/2012,  10:09 AM

## 2012-03-10 NOTE — Care Management Note (Signed)
    Page 1 of 2   03/11/2012     10:42:39 AM   CARE MANAGEMENT NOTE 03/11/2012  Patient:  Bianca Williams, Bianca Williams   Account Number:  [id number]  Date Initiated:  03/10/2012  Documentation initiated by:  Colleen Can  Subjective/Objective Assessment:   dx osteoarthritis rt knee; total knee replacemnt  Per-arranged with Genevieve Norlander for [facility] services     Action/Plan:   CM spoke with patient and spouse. Plans to go home to [facility] where spouse will be caregiver. Will need rw with armrest.   Anticipated DC Date:  03/11/2012   Anticipated DC Plan:  HOME W HOME HEALTH SERVICES  In-house referral  NA      DC Planning Services  CM consult      [facility] Choice  HOME HEALTH   Choice offered to / List presented to:  C-1 Patient   DME arranged  NA      DME agency  NA     HH arranged  HH-2 PT  HH-3 OT      [facility] agency  [facility]   Status of service:  Completed, signed off Medicare Important Message given?   (If response is "NO", the following Medicare IM given date fields will be blank) Date Medicare IM given:   Date Additional Medicare IM given:    Discharge Disposition:  HOME W HOME HEALTH SERVICES  Per UR Regulation:  Reviewed for med. necessity/level of care/duration of stay  If discussed at Long Length of Stay Meetings, dates discussed:    Comments:  03/11/2012 Bianca Williams BSN CCM [phone] Pt for discharge today with [facility] services in place. Services will start tomorrow 03/12/2012. DME is being arranged by [place].

## 2012-03-10 NOTE — Progress Notes (Signed)
Occupational Therapy Treatment Patient Details Name: Bianca Williams MRN: [id number] DOB: Aug 21, 1955 Today's Date: 03/10/2012 Time: 7829-5621 OT Time Calculation (min): 25 min  OT Assessment / Plan / Recommendation Comments on Treatment Session Pt making slow progress towards goals. Recommend HHOT f/u with pt at home.    Follow Up Recommendations  Home health OT    Barriers to Discharge       Equipment Recommendations  Other (comment);3 in 1 bedside comode    Recommendations for Other Services    Frequency Min 2X/week   Plan Discharge plan remains appropriate    Precautions / Restrictions Precautions Precautions: Knee Precaution Comments: L PFRW due to old wrist fx. Required Braces or Orthoses: Knee Immobilizer - Right Knee Immobilizer - Right: Discontinue once straight leg raise with < 10 degree lag Restrictions Weight Bearing Restrictions: No Other Position/Activity Restrictions: WBAT   Pertinent Vitals/Pain Reported 7/10 pain with mobility. Repositioned and cold applied.    ADL  Grooming: Performed;Wash/dry hands;Min guard Where Assessed - Grooming: Supported Copywriter, advertising: Performed;Minimal Dentist Method: Sit to Barista: Raised toilet seat with arms (or 3-in-1 over toilet) Toileting - Clothing Manipulation and Hygiene: Performed;Minimal assistance Where Assessed - Engineer, mining and Hygiene: Sit to stand from 3-in-1 or toilet Tub/Shower Transfer: Performed;Minimal assistance Tub/Shower Transfer Method: Science writer: Walk in shower Equipment Used: Rolling walker ADL Comments: Pt educated how to safely step into walk in shower and how to transfer on/off BSC. Pt will definately need for home use. Pt's family stated that they would assist with bathing/dressing.    OT Diagnosis:    OT Problem List:   OT Treatment Interventions:     OT Goals ADL Goals ADL Goal:  Toilet Transfer - Progress: Progressing toward goals ADL Goal: Toileting - Hygiene - Progress: Progressing toward goals ADL Goal: Tub/Shower Transfer - Progress: Progressing toward goals  Visit Information  Last OT Received On: 03/10/12 Assistance Needed: +1    Subjective Data  Subjective: I don't know if I'm ready to go home or not.   Prior Functioning       Cognition  Overall Cognitive Status: Appears within functional limits for tasks assessed/performed Arousal/Alertness: Awake/alert Orientation Level: Appears intact for tasks assessed Behavior During Session: [facility] for tasks performed    Mobility  Shoulder Instructions Bed Mobility Bed Mobility: Supine to Sit;Sitting - Scoot to Edge of Bed Supine to Sit: 4: Min assist;HOB elevated;With rails Sitting - Scoot to Edge of Bed: 4: Min guard Details for Bed Mobility Assistance: Assist for RLE out of bed with cues for hand placement/technique.  Transfers Sit to Stand: 4: Min assist;With upper extremity assist;With armrests;From chair/3-in-1 Stand to Sit: 4: Min assist;With upper extremity assist;With armrests;To chair/3-in-1 Details for Transfer Assistance: Physical A needed to rise and stabilze, control descent to [facility]. Max cues for technique, hand placement and LLE management.       Exercises      Balance     End of Session OT - End of Session Activity Tolerance: Patient limited by pain;Patient limited by fatigue Patient left: in chair;with call bell/phone within reach;with family/visitor present  GO     Bianca Williams A 03/10/2012, 1:21 PM

## 2012-03-10 NOTE — Progress Notes (Signed)
   Subjective: 2 Days Post-Op Procedure(s) (LRB): TOTAL KNEE ARTHROPLASTY (Right) Patient reports pain as mild and moderate.   Patient seen in rounds with Dr. Lequita Halt. Calf pain yesterday and doppler performed.  Was negative.  Continue therapy and if does well with both session, then home later today. Patient is having problems with pain in the knee, requiring pain medications Patient will work with therapy today and then possibly home later if does well.  Objective: Vital signs in last 24 hours: Temp:  [97.3 F (36.3 C)-100 F (37.8 C)] 100 F (37.8 C) (10/16 0522) Pulse Rate:  [69-102] 102  (10/16 0522) Resp:  [15-18] 16  (10/16 0522) BP: (109-117)/(60-82) 115/82 mmHg (10/16 0522) SpO2:  [95 %-100 %] 95 % (10/16 0522)  Intake/Output from previous day:  Intake/Output Summary (Last 24 hours) at 03/10/12 0757 Last data filed at 03/10/12 0550  Gross per 24 hour  Intake   1140 ml  Output   1400 ml  Net   -260 ml    Intake/Output this shift:    Labs:  Basename 03/10/12 0335 03/09/12 0400  HGB 8.5* 8.8*    Basename 03/10/12 0335 03/09/12 0400  WBC 9.8 11.4*  RBC 2.65* 2.74*  HCT 24.5* 24.8*  PLT 129* 144*    Basename 03/10/12 0335 03/09/12 0400  NA 137 137  K 3.3* 3.8  CL 101 102  CO2 31 27  BUN 7 12  CREATININE 0.53 0.57  GLUCOSE 124* 136*  CALCIUM 8.2* 8.3*   No results found for this basename: LABPT:2,INR:2 in the last 72 hours  EXAM: General - Patient is Alert, Appropriate and Oriented Extremity - Neurovascular intact Sensation intact distally Dorsiflexion/Plantar flexion intact No cellulitis present Incision - clean, dry, no drainage, healing Motor Function - intact, moving foot and toes well on exam.   Assessment/Plan: 2 Days Post-Op Procedure(s) (LRB): TOTAL KNEE ARTHROPLASTY (Right) Procedure(s) (LRB): TOTAL KNEE ARTHROPLASTY (Right) Past Medical History  Diagnosis Date  . Fractured     WRIST 12/26/2011 --CAST HAS BEEN REMOVED--STILL SORE AND  HEALING  . Osteoporosis   . Fibromyalgia   . Vertigo     HX OF MILD EPISODES  . Anxiety   . Depression     RESOLVED  . Hemorrhoids   . Arthritis   . Memory problem     THOUGHT TO BE RELATED TO LOW VITAMIN B--NOW ON SUPPLEMENT AND HAS NOTICED IMPROVEMENT  . Balance problem     PT ATTRIBUTES TO HER KNEE ARTHRITIS-BONE ON BONE   Principal Problem:  *OA (osteoarthritis) of knee Active Problems:  Postop Acute blood loss anemia  Postop Hypokalemia  Estimated Body mass index is 23.05 kg/(m^2) as calculated from the following:   Height as of this encounter: 5\' 1" (1.549 m).   Weight as of this encounter: 122 lb(55.339 kg). Discharge home with home health Diet - Diabetic diet Follow up - in 2 weeks Activity - WBAT Disposition - Home Condition Upon Discharge - Pending D/C Meds - See DC Summary DVT Prophylaxis - Xarelto  Arcangel Minion 03/10/2012, 7:57 AM

## 2012-03-11 LAB — CBC
HCT: 22.5 % — ABNORMAL LOW (ref 36.0–46.0)
Hemoglobin: 7.9 g/dL — ABNORMAL LOW (ref 12.0–15.0)
MCH: 32.4 pg (ref 26.0–34.0)
MCHC: 35.1 g/dL (ref 30.0–36.0)
MCV: 92.2 fL (ref 78.0–100.0)
Platelets: 126 K/uL — ABNORMAL LOW (ref 150–400)
RBC: 2.44 MIL/uL — ABNORMAL LOW (ref 3.87–5.11)
RDW: 12.3 % (ref 11.5–15.5)
WBC: 7.5 K/uL (ref 4.0–10.5)

## 2012-03-11 MED ORDER — HYDROMORPHONE HCL 2 MG PO TABS: 2.0000 mg | ORAL_TABLET | ORAL | Status: DC | PRN

## 2012-03-11 MED ORDER — RIVAROXABAN 10 MG PO TABS: 10.0000 mg | ORAL_TABLET | Freq: Every day | ORAL | Status: DC

## 2012-03-11 MED ORDER — POLYSACCHARIDE IRON COMPLEX 150 MG PO CAPS: 150.0000 mg | ORAL_CAPSULE | Freq: Two times a day (BID) | ORAL | Status: DC

## 2012-03-11 MED ORDER — POLYSACCHARIDE IRON COMPLEX 150 MG PO CAPS
150.0000 mg | ORAL_CAPSULE | Freq: Two times a day (BID) | ORAL | Status: DC
  Filled 2012-03-11: qty 1

## 2012-03-11 MED ORDER — METHOCARBAMOL 500 MG PO TABS: 500.0000 mg | ORAL_TABLET | Freq: Four times a day (QID) | ORAL | Status: DC | PRN

## 2012-03-11 MED ORDER — PROMETHAZINE HCL 25 MG PO TABS
12.5000 mg | ORAL_TABLET | ORAL | Status: DC | PRN
  Administered 2012-03-11: 12.5 mg via ORAL
  Filled 2012-03-11: qty 1

## 2012-03-11 NOTE — Progress Notes (Signed)
   Subjective: 3 Days Post-Op Procedure(s) (LRB): TOTAL KNEE ARTHROPLASTY (Right) Patient reports pain as mild.   Patient seen in rounds by Dr. Lequita Halt. Patient is well, and has had no acute complaints or problems Patient is ready to go home.  Objective: Vital signs in last 24 hours: Temp:  [97.4 F (36.3 C)-99 F (37.2 C)] 99 F (37.2 C) (10/17 0519) Pulse Rate:  [80-89] 86  (10/17 0519) Resp:  [15-16] 16  (10/17 0519) BP: (114-115)/(65-73) 115/69 mmHg (10/17 0519) SpO2:  [93 %-100 %] 93 % (10/17 0519)  Intake/Output from previous day:  Intake/Output Summary (Last 24 hours) at 03/11/12 0745 Last data filed at 03/10/12 1900  Gross per 24 hour  Intake    720 ml  Output    800 ml  Net    -80 ml    Intake/Output this shift:    Labs:  Basename 03/11/12 0340 03/10/12 0335 03/09/12 0400  HGB 7.9* 8.5* 8.8*    Basename 03/11/12 0340 03/10/12 0335  WBC 7.5 9.8  RBC 2.44* 2.65*  HCT 22.5* 24.5*  PLT 126* 129*    Basename 03/10/12 0335 03/09/12 0400  NA 137 137  K 3.3* 3.8  CL 101 102  CO2 31 27  BUN 7 12  CREATININE 0.53 0.57  GLUCOSE 124* 136*  CALCIUM 8.2* 8.3*   No results found for this basename: LABPT:2,INR:2 in the last 72 hours  EXAM: General - Patient is Alert, Appropriate and Oriented Extremity - Neurovascular intact Sensation intact distally Dorsiflexion/Plantar flexion intact No cellulitis present Incision - clean, dry, no drainage, healing Motor Function - intact, moving foot and toes well on exam.   Assessment/Plan: 3 Days Post-Op Procedure(s) (LRB): TOTAL KNEE ARTHROPLASTY (Right) Procedure(s) (LRB): TOTAL KNEE ARTHROPLASTY (Right) Past Medical History  Diagnosis Date  . Fractured     WRIST 12/26/2011 --CAST HAS BEEN REMOVED--STILL SORE AND HEALING  . Osteoporosis   . Fibromyalgia   . Vertigo     HX OF MILD EPISODES  . Anxiety   . Depression     RESOLVED  . Hemorrhoids   . Arthritis   . Memory problem     THOUGHT TO BE RELATED TO  LOW VITAMIN B--NOW ON SUPPLEMENT AND HAS NOTICED IMPROVEMENT  . Balance problem     PT ATTRIBUTES TO HER KNEE ARTHRITIS-BONE ON BONE   Principal Problem:  *OA (osteoarthritis) of knee Active Problems:  Postop Acute blood loss anemia  Postop Hypokalemia  Estimated Body mass index is 23.05 kg/(m^2) as calculated from the following:   Height as of this encounter: 5\' 1" (1.549 m).   Weight as of this encounter: 122 lb(55.339 kg). Discharge home with home health Diet - Diabetic diet Follow up - in 2 weeks Activity - WBAT Disposition - Home Condition Upon Discharge - Good D/C Meds - See DC Summary DVT Prophylaxis - Xarelto  Winferd Wease 03/11/2012, 7:45 AM

## 2012-03-11 NOTE — Progress Notes (Signed)
Physical Therapy Treatment Patient Details Name: Bianca Williams MRN: [id number] DOB: 06-12-1955 Today's Date: 03/11/2012 Time: 4098-1191 PT Time Calculation (min): 18 min  PT Assessment / Plan / Recommendation Comments on Treatment Session  Pt requests rest break prior to attempting stairs    Follow Up Recommendations  Home health PT     Does the patient have the potential to tolerate intense rehabilitation     Barriers to Discharge        Equipment Recommendations  Other (comment);3 in 1 bedside comode;Rolling walker with 5" wheels    Recommendations for Other Services OT consult  Frequency 7X/week   Plan Discharge plan remains appropriate    Precautions / Restrictions Precautions Precautions: Knee Precaution Comments: L PFRW due to old wrist fx. Required Braces or Orthoses: Knee Immobilizer - Right Knee Immobilizer - Right: Discontinue once straight leg raise with < 10 degree lag Restrictions Weight Bearing Restrictions: No Other Position/Activity Restrictions: WBAT   Pertinent Vitals/Pain 6/10; RN aware, cold packs provided    Mobility       Exercises Total Joint Exercises Ankle Circles/Pumps: AROM;15 reps;Supine;Both Quad Sets: AROM;15 reps;Supine;Both Heel Slides: AAROM;15 reps;Supine;Right Straight Leg Raises: AAROM;15 reps;Supine;Right   PT Diagnosis:    PT Problem List:   PT Treatment Interventions:     PT Goals Acute Rehab PT Goals PT Goal Formulation: With patient  Visit Information  Last PT Received On: 03/11/12 Assistance Needed: +1    Subjective Data  Subjective: I'm going home today but I need to work on stairs Patient Stated Goal: to get the L knee replaced and get on with life.    Cognition  Overall Cognitive Status: Appears within functional limits for tasks assessed/performed Arousal/Alertness: Awake/alert Orientation Level: Appears intact for tasks assessed Behavior During Session: [facility] for tasks performed    Balance     End  of Session PT - End of Session Activity Tolerance: Patient limited by fatigue;Patient limited by pain;Other (comment) (and nausea) Patient left: in bed;with call bell/phone within reach Nurse Communication: Mobility status CPM Right Knee CPM Right Knee: Off   GP     Bianca Williams 03/11/2012, 12:16 PM

## 2012-03-11 NOTE — Progress Notes (Signed)
Physical Therapy Treatment Patient Details Name: Bianca Williams MRN: [id number] DOB: Jul 29, 1955 Today's Date: 03/11/2012 Time: 1122-1200 PT Time Calculation (min): 38 min  PT Assessment / Plan / Recommendation Comments on Treatment Session  Reviewed don/doff KI    Follow Up Recommendations  Home health PT     Does the patient have the potential to tolerate intense rehabilitation     Barriers to Discharge        Equipment Recommendations  Other (comment);3 in 1 bedside comode;Rolling walker with 5" wheels    Recommendations for Other Services OT consult  Frequency 7X/week   Plan Discharge plan remains appropriate    Precautions / Restrictions Precautions Precautions: Knee Precaution Comments: L PFRW due to old wrist fx. Required Braces or Orthoses: Knee Immobilizer - Right Knee Immobilizer - Right: Discontinue once straight leg raise with < 10 degree lag Restrictions Weight Bearing Restrictions: No Other Position/Activity Restrictions: WBAT   Pertinent Vitals/Pain     Mobility  Bed Mobility Bed Mobility: Sit to Supine;Supine to Sit Supine to Sit: 4: Min guard;4: Min assist Sitting - Scoot to [place] of Bed: 5: Supervision Sit to Supine: 4: Min guard;4: Min assist Details for Bed Mobility Assistance: min cues for sequence Transfers Transfers: Sit to Stand;Stand to Sit Sit to Stand: 5: Supervision Stand to Sit: 5: Supervision Details for Transfer Assistance: cues for LE management and use of UEs Ambulation/Gait Ambulation/Gait Assistance: 4: Min assist Ambulation Distance (Feet): 75 Feet (and 2 x 20) Assistive device: Left platform walker Ambulation/Gait Assistance Details: cues for sequence and position from RW Gait Pattern: Step-to pattern Stairs: Yes Stairs Assistance: 4: Min assist Stairs Assistance Details (indicate cue type and reason): cues for sequence and for husbands assisting technique.  Spouse is assisting with hand under elbow for WB support  Stair  Management Technique: One rail Right;Step to pattern;Forwards Number of Stairs: 4  (x 4)    Exercises Total Joint Exercises Ankle Circles/Pumps: AROM;15 reps;Supine;Both Quad Sets: AROM;15 reps;Supine;Both Heel Slides: AAROM;15 reps;Supine;Right Straight Leg Raises: AAROM;15 reps;Supine;Right   PT Diagnosis:    PT Problem List:   PT Treatment Interventions:     PT Goals Acute Rehab PT Goals PT Goal Formulation: With patient Time For Goal Achievement: 03/11/12 Potential to Achieve Goals: Good Pt will go Supine/Side to Sit: with supervision PT Goal: Supine/Side to Sit - Progress: Progressing toward goal Pt will go Sit to Supine/Side: with supervision PT Goal: Sit to Supine/Side - Progress: Progressing toward goal Pt will go Sit to Stand: with supervision PT Goal: Sit to Stand - Progress: Met Pt will Ambulate: 51 - 150 feet;with supervision;with least restrictive assistive device PT Goal: Ambulate - Progress: Met Pt will Go Up / Down Stairs: 3-5 stairs;with min assist;with least restrictive assistive device PT Goal: Up/Down Stairs - Progress: Met Pt will Perform Home Exercise Program: with supervision, verbal cues required/provided PT Goal: Perform Home Exercise Program - Progress: Met  Visit Information  Last PT Received On: 03/11/12 Assistance Needed: +1    Subjective Data  Subjective: I'm going home today but I need to work on stairs Patient Stated Goal: to get the L knee replaced and get on with life.    Cognition  Overall Cognitive Status: Appears within functional limits for tasks assessed/performed Arousal/Alertness: Awake/alert Orientation Level: Appears intact for tasks assessed Behavior During Session: [facility] for tasks performed    Balance     End of Session PT - End of Session Equipment Utilized During Treatment: Right knee immobilizer Activity  Tolerance: Patient tolerated treatment well Patient left: in bed;with call bell/phone within reach Nurse  Communication: Mobility status CPM Right Knee CPM Right Knee: Off   GP     Shalissa Easterwood 03/11/2012, 12:29 PM

## 2012-03-15 NOTE — Discharge Summary (Signed)
Physician Discharge Summary   Patient ID: Bianca Williams MRN: [id number] DOB/AGE: 10/06/1955 56 y.o.  Admit date: 03/08/2012 Discharge date: 03/11/12  Primary Diagnosis: Osteoarthritis Right knee   Admission Diagnoses:  Past Medical History  Diagnosis Date  . Fractured     WRIST 12/26/2011 --CAST HAS BEEN REMOVED--STILL SORE AND HEALING  . Osteoporosis   . Fibromyalgia   . Vertigo     HX OF MILD EPISODES  . Anxiety   . Depression     RESOLVED  . Hemorrhoids   . Arthritis   . Memory problem     THOUGHT TO BE RELATED TO LOW VITAMIN B--NOW ON SUPPLEMENT AND HAS NOTICED IMPROVEMENT  . Balance problem     PT ATTRIBUTES TO HER KNEE ARTHRITIS-BONE ON BONE   Discharge Diagnoses:   Principal Problem:  *OA (osteoarthritis) of knee Active Problems:  Postop Acute blood loss anemia  Postop Hypokalemia  Estimated Body mass index is 23.05 kg/(m^2) as calculated from the following:   Height as of this encounter: 5\' 1" (1.549 m).   Weight as of this encounter: 122 lb(55.339 kg).  Classification of overweight in adults according to BMI (WHO, 1998)   Procedure:  Procedure(s) (LRB): TOTAL KNEE ARTHROPLASTY (Right)   Consults: None  HPI: TYLAR Williams is a 56 y.o. year old female with end stage OA of her right knee with progressively worsening pain and dysfunction. She has constant pain, with activity and at rest and significant functional deficits with difficulties even with ADLs. She has had extensive non-op management including analgesics, injections of cortisone, and home exercise program, but remains in significant pain with significant dysfunction. Radiographs show bone on bone arthritis. She presents now for right Total Knee Arthroplasty.   Laboratory Data: Hospital Outpatient Visit on 03/03/2012  Component Date Value Range Status  . MRSA, PCR 03/03/2012 NEGATIVE  NEGATIVE Final  . Staphylococcus aureus 03/03/2012 NEGATIVE  NEGATIVE Final   Comment:                        The Xpert SA Assay (FDA                          approved for NASAL specimens                          in patients over 76 years of age),                          is one component of                          a comprehensive surveillance                          program.  Test performance has                          been validated by [employer] for patients greater                          than or equal to 78 year old.  It is not intended                          to diagnose infection nor to                          guide or monitor treatment.   No results found for this basename: HGB:5 in the last 72 hours No results found for this basename: WBC:2,RBC:2,HCT:2,PLT:2 in the last 72 hours No results found for this basename: NA:2,K:2,CL:2,CO2:2,BUN:2,CREATININE:2,GLUCOSE:2,CALCIUM:2 in the last 72 hours No results found for this basename: LABPT:2,INR:2 in the last 72 hours  X-Rays:Mm Digital Screening  02/27/2012  *RADIOLOGY REPORT*  Clinical Data: Screening.  DIGITAL BILATERAL SCREENING MAMMOGRAM WITH CAD  Comparison:  Previous exams.  Findings:  The breast tissue is heterogeneously dense. No suspicious masses, architectural distortion, or calcifications are present.  Presumed fibroadenoma right lower inner quadrant is stable.  Images were processed with CAD.  IMPRESSION: No mammographic evidence of malignancy.  A result letter of this screening mammogram will be mailed directly to the patient.  RECOMMENDATION: Screening mammogram in one year. (Code:SM-B-01Y)  BI-RADS CATEGORY 1:  Negative.   Original Report Authenticated By: Harrel Lemon, M.D.     EKG:No orders found for this or any previous visit.   Hospital Course:  Bianca Williams is a 56 y.o. who was admitted to [facility]. They were brought to the operating room on 03/08/2012 and underwent Procedure(s): TOTAL KNEE ARTHROPLASTY.  Patient tolerated the  procedure well and was later transferred to the recovery room and then to the orthopaedic floor for postoperative care.  They were given PO and IV analgesics for pain control following their surgery.  They were given 24 hours of postoperative antibiotics of  Anti-infectives     Start     Dose/Rate Route Frequency Ordered Stop   03/08/12 1400   ceFAZolin (ANCEF) IVPB 1 g/50 mL premix        1 g 100 mL/hr over 30 Minutes Intravenous Every 6 hours 03/08/12 1025 03/08/12 2004   03/08/12 0531   ceFAZolin (ANCEF) 3 g in dextrose 5 % 50 mL IVPB        3 g 160 mL/hr over 30 Minutes Intravenous 60 min pre-op 03/08/12 0531 03/08/12 0726         and started on DVT prophylaxis in the form of Xarelto.   PT and OT were ordered for total joint protocol.  Discharge planning consulted to help with postop disposition and equipment needs.  Patient had a rough night on the evening of surgery with pain but started to get up OOB with therapy on day one.  PCA Dilaudid was discontinued and they were weaned over to PO meds.  Hemovac drain was pulled without difficulty.  Patient complained of severe calf pain and was concerned that she had developed a clot so a doppler was ordered.  Left lower extremity venous duplex was completed. Left lower extremity was negative for deep vein thrombosis.  Continued to work with therapy into day two.  Dressing was changed on day one due to drainage and the incision looked good and no active bleeding.  By day three, the patient had progressed with therapy and meeting their goals.  Incision was healing well.  Patient was seen in rounds and was ready to go home.  Discharge Medications: Prior to Admission medications   Medication Sig Start Date End Date Taking?  Authorizing Provider  sodium chloride (MURO 128) 5 % ophthalmic ointment Place 1 drop into both eyes at bedtime.   Yes Historical Provider, MD  HYDROmorphone (DILAUDID) 2 MG tablet Take 1-2 tablets (2-4 mg total) by mouth every 4  (four) hours as needed. 03/11/12   Yariah Selvey Julien Girt, PA  iron polysaccharides (NIFEREX) 150 MG capsule Take 1 capsule (150 mg total) by mouth 2 (two) times daily. 03/11/12   Tyleek Smick Julien Girt, PA  methocarbamol (ROBAXIN) 500 MG tablet Take 1 tablet (500 mg total) by mouth every 6 (six) hours as needed. 03/11/12   Yenty Bloch Julien Girt, PA  rivaroxaban (XARELTO) 10 MG TABS tablet Take 1 tablet (10 mg total) by mouth daily with breakfast. Take Xarelto for two and a half more weeks, then discontinue Xarelto. 03/11/12   Bobbijo Holst, PA    Diet: Diabetic diet Activity:WBAT Follow-up:in 2 weeks Disposition - Home Discharged Condition: good   Discharge Orders    Future Orders Please Complete By Expires   Diet Carb Modified      Call MD / Call 911      Comments:   If you experience chest pain or shortness of breath, CALL 911 and be transported to the hospital emergency room.  If you develope a fever above 101 F, pus (white drainage) or increased drainage or redness at the wound, or calf pain, call your surgeon's office.   Discharge instructions      Comments:   Pick up stool softner and laxative for home. Do not submerge incision under water. May shower. Continue to use ice for pain and swelling from surgery.  Take Xarelto for two and a half more weeks, then discontinue Xarelto.   Constipation Prevention      Comments:   Drink plenty of fluids.  Prune juice may be helpful.  You may use a stool softener, such as Colace (over the counter) 100 mg twice a day.  Use MiraLax (over the counter) for constipation as needed.   Increase activity slowly as tolerated      Patient may shower      Comments:   You may shower without a dressing once there is no drainage.  Do not wash over the wound.  If drainage remains, do not shower until drainage stops.   Weight bearing as tolerated      Driving restrictions      Comments:   No driving until released by the physician.   Lifting  restrictions      Comments:   No lifting until released by the physician.   TED hose      Comments:   Use stockings (TED hose) for 3 weeks on both leg(s).  You may remove them at night for sleeping.   Change dressing      Comments:   Change dressing daily with sterile 4 x 4 inch gauze dressing and apply TED hose. Do not submerge the incision under water.   Do not put a pillow under the knee. Place it under the heel.      Do not sit on low chairs, stoools or toilet seats, as it may be difficult to get up from low surfaces          Medication List     As of 03/15/2012  7:25 AM    STOP taking these medications         calcium carbonate 500 MG chewable tablet   Commonly known as: TUMS - dosed in mg elemental calcium  cholecalciferol 1000 UNITS tablet   Commonly known as: VITAMIN D      fish oil-omega-3 fatty acids 1000 MG capsule      vitamin B-12 1000 MCG tablet   Commonly known as: CYANOCOBALAMIN      TAKE these medications         HYDROmorphone 2 MG tablet   Commonly known as: DILAUDID   Take 1-2 tablets (2-4 mg total) by mouth every 4 (four) hours as needed.      iron polysaccharides 150 MG capsule   Commonly known as: NIFEREX   Take 1 capsule (150 mg total) by mouth 2 (two) times daily.      methocarbamol 500 MG tablet   Commonly known as: ROBAXIN   Take 1 tablet (500 mg total) by mouth every 6 (six) hours as needed.      MURO 128 5 % ophthalmic ointment   Generic drug: sodium chloride   Place 1 drop into both eyes at bedtime.      rivaroxaban 10 MG Tabs tablet   Commonly known as: XARELTO   Take 1 tablet (10 mg total) by mouth daily with breakfast. Take Xarelto for two and a half more weeks, then discontinue Xarelto.           Follow-up Information    Follow up with Loanne Drilling, MD. Schedule an appointment as soon as possible for a visit in 2 weeks.   Contact information:   [address], [address] [place]  [place] [address] [phone]          Signed: Patrica Duel 03/15/2012, 7:25 AM

## 2012-05-31 ENCOUNTER — Other Ambulatory Visit: Payer: Self-pay | Admitting: Orthopedic Surgery

## 2012-05-31 MED ORDER — BUPIVACAINE LIPOSOME 1.3 % IJ SUSP: 20.0000 mL | Freq: Once | INTRAMUSCULAR | Status: DC

## 2012-05-31 MED ORDER — DEXAMETHASONE SODIUM PHOSPHATE 10 MG/ML IJ SOLN: 10.0000 mg | Freq: Once | INTRAMUSCULAR | Status: DC

## 2012-05-31 NOTE — Progress Notes (Signed)
Preoperative surgical orders have been place into the Epic hospital system for KRISTEENA MEINEKE on 05/31/2012, 11:02 AM  by Patrica Duel for surgery on 07/12/2012.  Preop Total Knee orders including Experal, IV Tylenol, and IV Decadron as long as there are no contraindications to the above medications. Avel Peace, PA-C

## 2012-06-29 ENCOUNTER — Encounter (HOSPITAL_COMMUNITY): Payer: Self-pay | Admitting: Pharmacy Technician

## 2012-07-01 NOTE — Patient Instructions (Signed)
Bianca Williams Human  07/01/2012   Your procedure is scheduled on: 07/12/12    Report to [facility] at 0500 AM.  Call this number if you have problems the morning of surgery: [phone]   Remember:   Do not eat food or drink liquids after midnight.   Take these medicines the morning of surgery with A SIP OF WATER:    Do not wear jewelry, make-up or nail polish.  Do not wear lotions, powders, or perfumes.   Do not shave 48 hours prior to surgery.   Do not bring valuables to the hospital.  Contacts, dentures or bridgework may not be worn into surgery.  Leave suitcase in the car. After surgery it may be brought to your room.  For patients admitted to the hospital, checkout time is 11:00 AM the day of  discharge.     SEE CHG INSTRUCTION SHEET    Please read over the following fact sheets that you were given: MRSA Information, coughing and deep breathing exercises, leg exercises, Blood Transfusion Fact Sheet, Incentive Spirometry Fact Sheet                Failure to comply with these instructions may result in cancellation of your surgery.                Patient Signature ____________________________              Nurse Signature _____________________________

## 2012-07-02 ENCOUNTER — Encounter (HOSPITAL_COMMUNITY)
Admission: RE | Admit: 2012-07-02 | Discharge: 2012-07-02 | Disposition: A | Discharge status: Home / Self Care | Payer: BC Managed Care – PPO | Source: Ambulatory Visit | Attending: Orthopedic Surgery | Admitting: Orthopedic Surgery

## 2012-07-02 ENCOUNTER — Other Ambulatory Visit (HOSPITAL_COMMUNITY): Payer: BC Managed Care – PPO

## 2012-07-02 ENCOUNTER — Encounter (HOSPITAL_COMMUNITY): Payer: Self-pay

## 2012-07-02 DIAGNOSIS — Z01812 Encounter for preprocedural laboratory examination: Secondary | ICD-10-CM | POA: Insufficient documentation

## 2012-07-02 LAB — URINALYSIS, ROUTINE W REFLEX MICROSCOPIC
Bilirubin Urine: NEGATIVE
Ketones, ur: NEGATIVE mg/dL
Leukocytes, UA: NEGATIVE
Nitrite: NEGATIVE
Protein, ur: NEGATIVE mg/dL
Urobilinogen, UA: 0.2 mg/dL (ref 0.0–1.0)

## 2012-07-02 LAB — PROTIME-INR
INR: 0.99 (ref 0.00–1.49)
Prothrombin Time: 13 seconds (ref 11.6–15.2)

## 2012-07-02 LAB — COMPREHENSIVE METABOLIC PANEL
BUN: 23 mg/dL (ref 6–23)
CO2: 31 mEq/L (ref 19–32)
Calcium: 8.9 mg/dL (ref 8.4–10.5)
Chloride: 102 mEq/L (ref 96–112)
Creatinine, Ser: 0.58 mg/dL (ref 0.50–1.10)
GFR calc Af Amer: >90 mL/min (ref >90)
GFR calc non Af Amer: >90 mL/min (ref >90)
Total Bilirubin: 1.2 mg/dL (ref 0.3–1.2)

## 2012-07-02 LAB — CBC
HCT: 41.7 % (ref 36.0–46.0)
MCH: 28.7 pg (ref 26.0–34.0)
MCV: 86.2 fL (ref 78.0–100.0)
Platelets: 220 10*3/uL (ref 150–400)
RBC: 4.84 MIL/uL (ref 3.87–5.11)
RDW: 13.7 % (ref 11.5–15.5)
WBC: 8.3 10*3/uL (ref 4.0–10.5)

## 2012-07-11 NOTE — H&P (Signed)
TOTAL KNEE ADMISSION H&P  Patient is being admitted for left total knee arthroplasty.  Subjective:  Chief Complaint:left knee pain.  HPI: Bianca Williams [facility], 57 y.o. female, has a history of pain and functional disability in the left knee due to arthritis and has failed non-surgical conservative treatments for greater than 12 weeks to includeNSAID's and/or analgesics, corticosteriod injections, flexibility and strengthening excercises, weight reduction as appropriate and activity modification.  Onset of symptoms was gradual, starting 3 years ago with gradually worsening course since that time. The patient noted no past surgery on the left knee(s).  Patient currently rates pain in the left knee(s) at 7 out of 10 with activity. Patient has night pain, worsening of pain with activity and weight bearing, pain that interferes with activities of daily living, pain with passive range of motion and crepitus.  Patient has evidence of periarticular osteophytes, joint subluxation and joint space narrowing by imaging studies.There is no active infection.  Patient Active Problem List   Diagnosis Date Noted  . Postop Hypokalemia 03/10/2012  . Postop Acute blood loss anemia 03/09/2012  . OA (osteoarthritis) of knee 03/08/2012   Past Medical History  Diagnosis Date  . Fractured     WRIST 12/26/2011 --CAST HAS BEEN REMOVED--STILL SORE AND HEALING  . Osteoporosis   . Fibromyalgia   . Vertigo     HX OF MILD EPISODES  . Anxiety   . Depression     RESOLVED  . Hemorrhoids   . Arthritis   . Memory problem     THOUGHT TO BE RELATED TO LOW VITAMIN B--NOW ON SUPPLEMENT AND HAS NOTICED IMPROVEMENT  . Balance problem     PT ATTRIBUTES TO HER KNEE ARTHRITIS-BONE ON BONE    Past Surgical History  Procedure Laterality Date  . Gastric bypass surgery  11/08/89    ROUX-EN -Y  . Cesarean section  09/16/84  . Hernia repair  ? 1995    UMBILICAL HERNIA REPAIR  . Cholecystectomy  2003 OR 2004  . Left rotator cuff   03/19/10  . Total knee arthroplasty  03/08/2012    Procedure: TOTAL KNEE ARTHROPLASTY;  Surgeon: Loanne Drilling, MD;  Location: [facility];  Service: Orthopedics;  Laterality: Right;    Current outpatient prescriptions: calcium carbonate (TUMS - DOSED IN MG ELEMENTAL CALCIUM) 500 MG chewable tablet, Chew 6 tablets by mouth daily. , Disp: , Rfl: ;   cholecalciferol (VITAMIN D) 1000 UNITS tablet, Take 3,000 Units by mouth daily., Disp: , Rfl: ;   Multiple Vitamin (MULTIVITAMIN WITH MINERALS) TABS, Take 1 tablet by mouth daily., Disp: , Rfl:  OVER THE COUNTER MEDICATION, Calcium chewables 250 mg each- Take 2 daily  Started on 06/26/12, Disp: , Rfl: ;   sodium chloride (MURO 128) 5 % ophthalmic ointment, Place 1 drop into both eyes at bedtime., Disp: , Rfl: ;   vitamin B-12 (CYANOCOBALAMIN) 1000 MCG tablet, Take 1,000 mcg by mouth daily., Disp: , Rfl:   Allergies  Allergen Reactions  . Keflex (Cephalexin) Hives and Itching  . Morphine And Related     MADE PT SICK TO STOMACH  . Sulfa Antibiotics     SICK TO STOMACH  . Vicodin (Hydrocodone-Acetaminophen) Hives and Itching  . Naproxen Itching, Swelling and Rash    History  Substance Use Topics  . Smoking status: Never Smoker   . Smokeless tobacco: Never Used  . Alcohol Use: Yes     Comment: SOMETIMES COUPLE OF GLASSES OF WINE A DAY    Family  History Mother deceased age 64 due to stroke, CAD Brother- CAD   Review of Systems  Constitutional: Negative.   HENT: Negative.  Negative for neck pain.   Eyes: Negative.   Respiratory: Negative.   Cardiovascular: Negative.   Gastrointestinal: Negative.   Genitourinary: Negative.   Musculoskeletal: Positive for joint pain. Negative for myalgias, back pain and falls.       Left knee pain  Skin: Negative.   Neurological: Negative.   Endo/Heme/Allergies: Positive for environmental allergies. Negative for polydipsia. Does not bruise/bleed easily.  Psychiatric/Behavioral: Negative.      Objective:  Physical Exam  Constitutional: She is oriented to person, place, and time. She appears well-developed and well-nourished. No distress.  HENT:  Head: Normocephalic and atraumatic.  Right Ear: External ear normal.  Left Ear: External ear normal.  Nose: Nose normal.  Mouth/Throat: Oropharynx is clear and moist.  Eyes: Conjunctivae and EOM are normal.  Neck: Normal range of motion. Neck supple. No tracheal deviation present. No thyromegaly present.  Cardiovascular: Normal rate, regular rhythm, normal heart sounds and intact distal pulses.   No murmur heard. Respiratory: Breath sounds normal. No respiratory distress. She has no wheezes. She exhibits no tenderness.  GI: Soft. Bowel sounds are normal. She exhibits no distension and no mass. There is no tenderness.  Musculoskeletal:       Right hip: Normal.       Left hip: Normal.       Right knee: She exhibits no swelling, no effusion and no erythema.       Left knee: She exhibits decreased range of motion. She exhibits no swelling, no effusion and no erythema. Tenderness found. Medial joint line tenderness noted.       Right lower leg: She exhibits no tenderness and no swelling.       Left lower leg: She exhibits no tenderness and no swelling.  Right total knee incision healed with no signs of infection. ROM 3-120 degrees. Left knee has a mild varus deformity with 5 to 120 degrees of motion. Moderate patellofemoral crepitus and tenderness along the medial joint line.   Lymphadenopathy:    She has no cervical adenopathy.  Neurological: She is alert and oriented to person, place, and time. She has normal strength and normal reflexes. No sensory deficit.  Skin: No rash noted. She is not diaphoretic. No erythema.  Psychiatric: She has a normal mood and affect. Her behavior is normal.    Vitals Weight: 118 lb Height: 61 in Body Surface Area: 1.52 m Body Mass Index: 22.3 kg/m BP: 128/76 (Sitting, Left Arm,  Standard)   Estimated body mass index is 23.16 kg/(m^2) as calculated from the following:   Height as of 03/08/12: 5\' 1"  (1.549 m).   Weight as of 03/03/12: 55.566 kg (122 lb 8 oz).   Imaging Review Plain radiographs demonstrate severe degenerative joint disease of the left knee(s). The overall alignment ismild varus. The bone quality appears to be good for age and reported activity level.  Assessment/Plan:  End stage arthritis, left knee   The patient history, physical examination, clinical judgment of the provider and imaging studies are consistent with end stage degenerative joint disease of the left knee(s) and total knee arthroplasty is deemed medically necessary. The treatment options including medical management, injection therapy arthroscopy and arthroplasty were discussed at length. The risks and benefits of total knee arthroplasty were presented and reviewed. The risks due to aseptic loosening, infection, stiffness, patella tracking problems, thromboembolic complications and other imponderables  were discussed. The patient acknowledged the explanation, agreed to proceed with the plan and consent was signed. Patient is being admitted for inpatient treatment for surgery, pain control, PT, OT, prophylactic antibiotics, VTE prophylaxis, progressive ambulation and ADL's and discharge planning. The patient is planning to be discharged home but will attend outpatient rehab immediately     [employer], PA-C

## 2012-07-12 ENCOUNTER — Ambulatory Visit (HOSPITAL_COMMUNITY): Payer: BC Managed Care – PPO | Admitting: Registered Nurse

## 2012-07-12 ENCOUNTER — Encounter (HOSPITAL_COMMUNITY): Payer: Self-pay | Admitting: *Deleted

## 2012-07-12 ENCOUNTER — Encounter (HOSPITAL_COMMUNITY): Payer: Self-pay | Admitting: Registered Nurse

## 2012-07-12 ENCOUNTER — Inpatient Hospital Stay (HOSPITAL_COMMUNITY)
Admission: RE | Admit: 2012-07-12 | Discharge: 2012-07-14 | DRG: 209 | Disposition: A | Discharge status: Home Health | Payer: BC Managed Care – PPO | Source: Ambulatory Visit | Attending: Orthopedic Surgery | Admitting: Orthopedic Surgery

## 2012-07-12 ENCOUNTER — Inpatient Hospital Stay: Admit: 2012-07-12 | Payer: Self-pay | Admitting: Orthopedic Surgery

## 2012-07-12 ENCOUNTER — Encounter (HOSPITAL_COMMUNITY)
Admission: RE | Disposition: A | Discharge status: Home Health | Payer: Self-pay | Source: Ambulatory Visit | Attending: Orthopedic Surgery

## 2012-07-12 DIAGNOSIS — Z79899 Other long term (current) drug therapy: Secondary | ICD-10-CM

## 2012-07-12 DIAGNOSIS — F3289 Other specified depressive episodes: Secondary | ICD-10-CM | POA: Diagnosis present

## 2012-07-12 DIAGNOSIS — F329 Major depressive disorder, single episode, unspecified: Secondary | ICD-10-CM | POA: Diagnosis present

## 2012-07-12 DIAGNOSIS — Z96659 Presence of unspecified artificial knee joint: Secondary | ICD-10-CM

## 2012-07-12 DIAGNOSIS — M1710 Unilateral primary osteoarthritis, unspecified knee: Principal | ICD-10-CM | POA: Diagnosis present

## 2012-07-12 DIAGNOSIS — IMO0001 Reserved for inherently not codable concepts without codable children: Secondary | ICD-10-CM | POA: Diagnosis present

## 2012-07-12 DIAGNOSIS — Z9884 Bariatric surgery status: Secondary | ICD-10-CM

## 2012-07-12 DIAGNOSIS — F411 Generalized anxiety disorder: Secondary | ICD-10-CM | POA: Diagnosis present

## 2012-07-12 DIAGNOSIS — D62 Acute posthemorrhagic anemia: Secondary | ICD-10-CM | POA: Diagnosis present

## 2012-07-12 HISTORY — PX: TOTAL KNEE ARTHROPLASTY: SHX125

## 2012-07-12 LAB — TYPE AND SCREEN: ABO/RH(D): O POS

## 2012-07-12 SURGERY — ARTHROPLASTY, KNEE, TOTAL
Anesthesia: General | Site: Knee | Laterality: Left | Wound class: Clean

## 2012-07-12 SURGERY — ARTHROPLASTY, KNEE, TOTAL
Anesthesia: Choice | Site: Knee | Laterality: Left

## 2012-07-12 MED ORDER — HYDROMORPHONE HCL PF 1 MG/ML IJ SOLN
INTRAMUSCULAR | Status: DC | PRN
  Administered 2012-07-12: 1 mg via INTRAVENOUS

## 2012-07-12 MED ORDER — ACETAMINOPHEN 10 MG/ML IV SOLN
INTRAVENOUS | Status: DC | PRN
  Administered 2012-07-12: 1000 mg via INTRAVENOUS

## 2012-07-12 MED ORDER — HYDROMORPHONE HCL 2 MG PO TABS
2.0000 mg | ORAL_TABLET | ORAL | Status: DC | PRN
  Administered 2012-07-12: 2 mg via ORAL
  Administered 2012-07-13: 4 mg via ORAL
  Administered 2012-07-13: 2 mg via ORAL
  Administered 2012-07-13 (×2): 4 mg via ORAL
  Administered 2012-07-13: 2 mg via ORAL
  Administered 2012-07-13: 4 mg via ORAL
  Administered 2012-07-14 (×2): 2 mg via ORAL
  Administered 2012-07-14: 4 mg via ORAL
  Administered 2012-07-14 (×2): 2 mg via ORAL
  Filled 2012-07-12: qty 1
  Filled 2012-07-12: qty 2
  Filled 2012-07-12: qty 1
  Filled 2012-07-12: qty 2
  Filled 2012-07-12: qty 1
  Filled 2012-07-12 (×3): qty 2
  Filled 2012-07-12: qty 1
  Filled 2012-07-12: qty 2
  Filled 2012-07-12 (×3): qty 1

## 2012-07-12 MED ORDER — NEOSTIGMINE METHYLSULFATE 1 MG/ML IJ SOLN
INTRAMUSCULAR | Status: DC | PRN
  Administered 2012-07-12: 3.5 mg via INTRAVENOUS

## 2012-07-12 MED ORDER — HYDROMORPHONE HCL PF 1 MG/ML IJ SOLN
0.5000 mg | INTRAMUSCULAR | Status: DC | PRN
  Administered 2012-07-12: 1 mg via INTRAVENOUS
  Filled 2012-07-12 (×2): qty 1

## 2012-07-12 MED ORDER — FLEET ENEMA 7-19 GM/118ML RE ENEM: 1.0000 | ENEMA | Freq: Once | RECTAL | Status: AC | PRN

## 2012-07-12 MED ORDER — MIDAZOLAM HCL 5 MG/5ML IJ SOLN
INTRAMUSCULAR | Status: DC | PRN
  Administered 2012-07-12: 2 mg via INTRAVENOUS

## 2012-07-12 MED ORDER — 0.9 % SODIUM CHLORIDE (POUR BTL) OPTIME
TOPICAL | Status: DC | PRN
  Administered 2012-07-12: 1000 mL

## 2012-07-12 MED ORDER — LACTATED RINGERS IV SOLN: INTRAVENOUS | Status: DC

## 2012-07-12 MED ORDER — FENTANYL CITRATE 0.05 MG/ML IJ SOLN
INTRAMUSCULAR | Status: DC | PRN
  Administered 2012-07-12: 50 ug via INTRAVENOUS
  Administered 2012-07-12: 100 ug via INTRAVENOUS
  Administered 2012-07-12 (×2): 50 ug via INTRAVENOUS

## 2012-07-12 MED ORDER — SODIUM CHLORIDE 0.9 % IV SOLN: INTRAVENOUS | Status: DC

## 2012-07-12 MED ORDER — PHENOL 1.4 % MT LIQD: 1.0000 | OROMUCOSAL | Status: DC | PRN

## 2012-07-12 MED ORDER — MENTHOL 3 MG MT LOZG: 1.0000 | LOZENGE | OROMUCOSAL | Status: DC | PRN

## 2012-07-12 MED ORDER — ONDANSETRON HCL 4 MG/2ML IJ SOLN
INTRAMUSCULAR | Status: DC | PRN
  Administered 2012-07-12: 4 mg via INTRAVENOUS

## 2012-07-12 MED ORDER — RIVAROXABAN 10 MG PO TABS: 10.0000 mg | ORAL_TABLET | Freq: Every day | ORAL | Status: DC

## 2012-07-12 MED ORDER — VANCOMYCIN HCL IN DEXTROSE 1-5 GM/200ML-% IV SOLN
INTRAVENOUS | Status: AC
  Filled 2012-07-12: qty 200

## 2012-07-12 MED ORDER — ACETAMINOPHEN 10 MG/ML IV SOLN
INTRAVENOUS | Status: AC
  Filled 2012-07-12: qty 100

## 2012-07-12 MED ORDER — BUPIVACAINE LIPOSOME 1.3 % IJ SUSP
20.0000 mL | Freq: Once | INTRAMUSCULAR | Status: AC
  Administered 2012-07-12: 20 mL
  Filled 2012-07-12: qty 20

## 2012-07-12 MED ORDER — METOCLOPRAMIDE HCL 10 MG PO TABS: 5.0000 mg | ORAL_TABLET | Freq: Three times a day (TID) | ORAL | Status: DC | PRN

## 2012-07-12 MED ORDER — HYDROMORPHONE HCL PF 1 MG/ML IJ SOLN
0.2500 mg | INTRAMUSCULAR | Status: DC | PRN
  Administered 2012-07-12 (×4): 0.5 mg via INTRAVENOUS

## 2012-07-12 MED ORDER — CHLORHEXIDINE GLUCONATE 4 % EX LIQD
60.0000 mL | Freq: Once | CUTANEOUS | Status: DC
  Filled 2012-07-12: qty 60

## 2012-07-12 MED ORDER — METHOCARBAMOL 100 MG/ML IJ SOLN
500.0000 mg | Freq: Four times a day (QID) | INTRAVENOUS | Status: DC | PRN
  Administered 2012-07-12: 500 mg via INTRAVENOUS
  Filled 2012-07-12 (×2): qty 5

## 2012-07-12 MED ORDER — ACETAMINOPHEN 10 MG/ML IV SOLN: 1000.0000 mg | Freq: Once | INTRAVENOUS | Status: DC

## 2012-07-12 MED ORDER — ONDANSETRON HCL 4 MG PO TABS
4.0000 mg | ORAL_TABLET | Freq: Four times a day (QID) | ORAL | Status: DC | PRN
  Administered 2012-07-14 (×2): 4 mg via ORAL
  Filled 2012-07-12 (×2): qty 1

## 2012-07-12 MED ORDER — HYDROMORPHONE HCL PF 1 MG/ML IJ SOLN
INTRAMUSCULAR | Status: AC
  Filled 2012-07-12: qty 2

## 2012-07-12 MED ORDER — ACETAMINOPHEN 325 MG PO TABS
650.0000 mg | ORAL_TABLET | Freq: Four times a day (QID) | ORAL | Status: DC | PRN
  Administered 2012-07-13 – 2012-07-14 (×2): 650 mg via ORAL
  Filled 2012-07-12 (×2): qty 2

## 2012-07-12 MED ORDER — DEXTROSE-NACL 5-0.9 % IV SOLN
INTRAVENOUS | Status: DC
  Administered 2012-07-12 – 2012-07-13 (×3): via INTRAVENOUS

## 2012-07-12 MED ORDER — DEXAMETHASONE 6 MG PO TABS
10.0000 mg | ORAL_TABLET | Freq: Once | ORAL | Status: AC
  Administered 2012-07-13: 10 mg via ORAL
  Filled 2012-07-12: qty 1

## 2012-07-12 MED ORDER — TRAMADOL HCL 50 MG PO TABS: 50.0000 mg | ORAL_TABLET | Freq: Four times a day (QID) | ORAL | Status: DC | PRN

## 2012-07-12 MED ORDER — SODIUM CHLORIDE (HYPERTONIC) 5 % OP OINT
TOPICAL_OINTMENT | Freq: Every day | OPHTHALMIC | Status: DC
  Administered 2012-07-12: 22:00:00 via OPHTHALMIC
  Filled 2012-07-12: qty 3.5

## 2012-07-12 MED ORDER — LIDOCAINE HCL (CARDIAC) 20 MG/ML IV SOLN
INTRAVENOUS | Status: DC | PRN
  Administered 2012-07-12: 50 mg via INTRAVENOUS

## 2012-07-12 MED ORDER — VANCOMYCIN HCL IN DEXTROSE 1-5 GM/200ML-% IV SOLN
1000.0000 mg | Freq: Two times a day (BID) | INTRAVENOUS | Status: AC
  Administered 2012-07-12: 1000 mg via INTRAVENOUS
  Filled 2012-07-12: qty 200

## 2012-07-12 MED ORDER — ROCURONIUM BROMIDE 100 MG/10ML IV SOLN
INTRAVENOUS | Status: DC | PRN
  Administered 2012-07-12: 30 mg via INTRAVENOUS

## 2012-07-12 MED ORDER — POLYETHYLENE GLYCOL 3350 17 G PO PACK: 17.0000 g | PACK | Freq: Every day | ORAL | Status: DC | PRN

## 2012-07-12 MED ORDER — DEXAMETHASONE SODIUM PHOSPHATE 10 MG/ML IJ SOLN
INTRAMUSCULAR | Status: DC | PRN
  Administered 2012-07-12: 10 mg via INTRAVENOUS

## 2012-07-12 MED ORDER — VANCOMYCIN HCL 10 G IV SOLR
1500.0000 mg | INTRAVENOUS | Status: AC
  Administered 2012-07-12: 1000 mg via INTRAVENOUS
  Filled 2012-07-12: qty 1500

## 2012-07-12 MED ORDER — METOCLOPRAMIDE HCL 5 MG/ML IJ SOLN: 10.0000 mg | Freq: Once | INTRAMUSCULAR | Status: DC | PRN

## 2012-07-12 MED ORDER — ACETAMINOPHEN 10 MG/ML IV SOLN
1000.0000 mg | Freq: Four times a day (QID) | INTRAVENOUS | Status: AC
  Administered 2012-07-12 – 2012-07-13 (×4): 1000 mg via INTRAVENOUS
  Filled 2012-07-12 (×7): qty 100

## 2012-07-12 MED ORDER — ACETAMINOPHEN 650 MG RE SUPP: 650.0000 mg | Freq: Four times a day (QID) | RECTAL | Status: DC | PRN

## 2012-07-12 MED ORDER — METOCLOPRAMIDE HCL 5 MG/ML IJ SOLN
5.0000 mg | Freq: Three times a day (TID) | INTRAMUSCULAR | Status: DC | PRN
  Administered 2012-07-12: 10 mg via INTRAVENOUS
  Filled 2012-07-12: qty 2

## 2012-07-12 MED ORDER — OXYCODONE HCL 5 MG/5ML PO SOLN
5.0000 mg | Freq: Once | ORAL | Status: DC | PRN
  Filled 2012-07-12: qty 5

## 2012-07-12 MED ORDER — DIPHENHYDRAMINE HCL 12.5 MG/5ML PO ELIX: 12.5000 mg | ORAL_SOLUTION | ORAL | Status: DC | PRN

## 2012-07-12 MED ORDER — ONDANSETRON HCL 4 MG/2ML IJ SOLN
4.0000 mg | Freq: Four times a day (QID) | INTRAMUSCULAR | Status: DC | PRN
  Administered 2012-07-12 – 2012-07-13 (×3): 4 mg via INTRAVENOUS
  Filled 2012-07-12 (×4): qty 2

## 2012-07-12 MED ORDER — RIVAROXABAN 10 MG PO TABS
10.0000 mg | ORAL_TABLET | Freq: Every day | ORAL | Status: DC
  Administered 2012-07-13 – 2012-07-14 (×2): 10 mg via ORAL
  Filled 2012-07-12 (×3): qty 1

## 2012-07-12 MED ORDER — GLYCOPYRROLATE 0.2 MG/ML IJ SOLN
INTRAMUSCULAR | Status: DC | PRN
  Administered 2012-07-12: .6 mg via INTRAVENOUS

## 2012-07-12 MED ORDER — LACTATED RINGERS IV SOLN
INTRAVENOUS | Status: DC | PRN
  Administered 2012-07-12 (×2): via INTRAVENOUS

## 2012-07-12 MED ORDER — OXYCODONE HCL 5 MG PO TABS: 5.0000 mg | ORAL_TABLET | Freq: Once | ORAL | Status: DC | PRN

## 2012-07-12 MED ORDER — PROPOFOL 10 MG/ML IV BOLUS
INTRAVENOUS | Status: DC | PRN
  Administered 2012-07-12: 120 mg via INTRAVENOUS

## 2012-07-12 MED ORDER — DEXAMETHASONE SODIUM PHOSPHATE 10 MG/ML IJ SOLN: 10.0000 mg | Freq: Once | INTRAMUSCULAR | Status: AC

## 2012-07-12 MED ORDER — SODIUM CHLORIDE 0.9 % IJ SOLN
INTRAMUSCULAR | Status: DC | PRN
  Administered 2012-07-12: 50 mL

## 2012-07-12 MED ORDER — BISACODYL 10 MG RE SUPP: 10.0000 mg | Freq: Every day | RECTAL | Status: DC | PRN

## 2012-07-12 MED ORDER — BUPIVACAINE ON-Q PAIN PUMP (FOR ORDER SET NO CHG)
INJECTION | Status: DC
  Filled 2012-07-12: qty 1

## 2012-07-12 MED ORDER — SODIUM CHLORIDE 0.9 % IR SOLN
Status: DC | PRN
  Administered 2012-07-12: 1000 mL

## 2012-07-12 MED ORDER — DOCUSATE SODIUM 100 MG PO CAPS
100.0000 mg | ORAL_CAPSULE | Freq: Two times a day (BID) | ORAL | Status: DC
  Administered 2012-07-12 – 2012-07-13 (×3): 100 mg via ORAL

## 2012-07-12 MED ORDER — METHOCARBAMOL 500 MG PO TABS
500.0000 mg | ORAL_TABLET | Freq: Four times a day (QID) | ORAL | Status: DC | PRN
  Administered 2012-07-13 – 2012-07-14 (×2): 500 mg via ORAL
  Filled 2012-07-12 (×2): qty 1

## 2012-07-12 SURGICAL SUPPLY — 52 items
BAG SPEC THK2 15X12 ZIP CLS (MISCELLANEOUS) ×1
BAG ZIPLOCK 12X15 (MISCELLANEOUS) ×2 IMPLANT
BANDAGE ELASTIC 6 VELCRO ST LF (GAUZE/BANDAGES/DRESSINGS) ×2 IMPLANT
BANDAGE ESMARK 6X9 LF (GAUZE/BANDAGES/DRESSINGS) ×1 IMPLANT
BLADE SAG 18X100X1.27 (BLADE) ×2 IMPLANT
BLADE SAW SGTL 11.0X1.19X90.0M (BLADE) ×2 IMPLANT
BNDG CMPR 9X6 STRL LF SNTH (GAUZE/BANDAGES/DRESSINGS) ×1
BNDG ESMARK 6X9 LF (GAUZE/BANDAGES/DRESSINGS) ×2
BOWL SMART MIX CTS (DISPOSABLE) ×2 IMPLANT
CEMENT HV SMART SET (Cement) ×4 IMPLANT
CLOTH BEACON ORANGE TIMEOUT ST (SAFETY) ×2 IMPLANT
CUFF TOURN SGL QUICK 34 (TOURNIQUET CUFF) ×2
CUFF TRNQT CYL 34X4X40X1 (TOURNIQUET CUFF) ×1 IMPLANT
DRAPE EXTREMITY T 121X128X90 (DRAPE) ×2 IMPLANT
DRAPE POUCH INSTRU U-SHP 10X18 (DRAPES) ×2 IMPLANT
DRAPE U-SHAPE 47X51 STRL (DRAPES) ×2 IMPLANT
DRSG ADAPTIC 3X8 NADH LF (GAUZE/BANDAGES/DRESSINGS) ×2 IMPLANT
DRSG PAD ABDOMINAL 8X10 ST (GAUZE/BANDAGES/DRESSINGS) ×1 IMPLANT
DURAPREP 26ML APPLICATOR (WOUND CARE) ×2 IMPLANT
ELECT REM PT RETURN 9FT ADLT (ELECTROSURGICAL) ×2
ELECTRODE REM PT RTRN 9FT ADLT (ELECTROSURGICAL) ×1 IMPLANT
EVACUATOR 1/8 PVC DRAIN (DRAIN) ×2 IMPLANT
FACESHIELD LNG OPTICON STERILE (SAFETY) ×10 IMPLANT
GLOVE BIO SURGEON STRL SZ8 (GLOVE) ×2 IMPLANT
GLOVE BIOGEL PI IND STRL 8 (GLOVE) ×2 IMPLANT
GLOVE BIOGEL PI INDICATOR 8 (GLOVE) ×1
GLOVE SURG SS PI 6.5 STRL IVOR (GLOVE) ×4 IMPLANT
GOWN STRL NON-REIN LRG LVL3 (GOWN DISPOSABLE) ×6 IMPLANT
HANDPIECE INTERPULSE COAX TIP (DISPOSABLE) ×2
IMMOBILIZER KNEE 20 (SOFTGOODS) ×2
IMMOBILIZER KNEE 20 THIGH 36 (SOFTGOODS) ×1 IMPLANT
KIT BASIN OR (CUSTOM PROCEDURE TRAY) ×2 IMPLANT
MANIFOLD NEPTUNE II (INSTRUMENTS) ×2 IMPLANT
NDL SAFETY ECLIPSE 18X1.5 (NEEDLE) ×1 IMPLANT
NEEDLE HYPO 18GX1.5 SHARP (NEEDLE) ×2
NS IRRIG 1000ML POUR BTL (IV SOLUTION) ×2 IMPLANT
PACK TOTAL JOINT (CUSTOM PROCEDURE TRAY) ×2 IMPLANT
PADDING CAST COTTON 6X4 STRL (CAST SUPPLIES) ×6 IMPLANT
POSITIONER SURGICAL ARM (MISCELLANEOUS) ×2 IMPLANT
SET HNDPC FAN SPRY TIP SCT (DISPOSABLE) ×1 IMPLANT
SPONGE GAUZE 4X4 12PLY (GAUZE/BANDAGES/DRESSINGS) ×2 IMPLANT
STRIP CLOSURE SKIN 1/2X4 (GAUZE/BANDAGES/DRESSINGS) ×4 IMPLANT
SUCTION FRAZIER 12FR DISP (SUCTIONS) ×2 IMPLANT
SUT MNCRL AB 4-0 PS2 18 (SUTURE) ×2 IMPLANT
SUT VIC AB 2-0 CT1 27 (SUTURE) ×6
SUT VIC AB 2-0 CT1 TAPERPNT 27 (SUTURE) ×3 IMPLANT
SUT VLOC 180 0 24IN GS25 (SUTURE) ×2 IMPLANT
SYR 50ML LL SCALE MARK (SYRINGE) ×2 IMPLANT
TOWEL OR 17X26 10 PK STRL BLUE (TOWEL DISPOSABLE) ×4 IMPLANT
TRAY FOLEY CATH 14FRSI W/METER (CATHETERS) ×2 IMPLANT
WATER STERILE IRR 1500ML POUR (IV SOLUTION) ×3 IMPLANT
WRAP KNEE MAXI GEL POST OP (GAUZE/BANDAGES/DRESSINGS) ×3 IMPLANT

## 2012-07-12 NOTE — Op Note (Signed)
Pre-operative diagnosis- Osteoarthritis  Left knee(s)  Post-operative diagnosis- Osteoarthritis Left knee(s)  Procedure-  Left  Total Knee Arthroplasty  Surgeon- Gus Rankin. Adrinne Sze, MD  Assistant- Dimitri Ped, PA-C   Anesthesia-  General EBL-* No blood loss amount entered *  Drains Hemovac  Tourniquet time-  Total Tourniquet Time Documented: Thigh (Left) - 32 minutes Total: Thigh (Left) - 32 minutes    Complications- None  Condition-PACU - hemodynamically stable.   Brief Clinical Note  Bianca Williams is a 57 y.o. year old female with end stage OA of her left knee with progressively worsening pain and dysfunction. She has constant pain, with activity and at rest and significant functional deficits with difficulties even with ADLs. She has had extensive non-op management including analgesics, injections of cortisone, and home exercise program, but remains in significant pain with significant dysfunction. Radiographs show bone on bone arthritis medial and patellofemoral. She presents now for left Total Knee Arthroplasty.     Procedure in detail---   The patient is brought into the operating room and positioned supine on the operating table. After successful administration of  General,   a tourniquet is placed high on the  Left thigh(s) and the lower extremity is prepped and draped in the usual sterile fashion. Time out is performed by the operating team and then the  Left lower extremity is wrapped in Esmarch, knee flexed and the tourniquet inflated to 300 mmHg.       A midline incision is made with a ten blade through the subcutaneous tissue to the level of the extensor mechanism. A fresh blade is used to make a medial parapatellar arthrotomy. Soft tissue over the proximal medial tibia is subperiosteally elevated to the joint line with a knife and into the semimembranosus bursa with a Cobb elevator. Soft tissue over the proximal lateral tibia is elevated with attention being paid to  avoiding the patellar tendon on the tibial tubercle. The patella is everted, knee flexed 90 degrees and the ACL and PCL are removed. Findings are bone on bone medial and patellofemoral with large medial osteophytes.        The drill is used to create a starting hole in the distal femur and the canal is thoroughly irrigated with sterile saline to remove the fatty contents. The 5 degree Left  valgus alignment guide is placed into the femoral canal and the distal femoral cutting block is pinned to remove 10 mm off the distal femur. Resection is made with an oscillating saw.      The tibia is subluxed forward and the menisci are removed. The extramedullary alignment guide is placed referencing proximally at the medial aspect of the tibial tubercle and distally along the second metatarsal axis and tibial crest. The block is pinned to remove 2mm off the more deficient medial  side. Resection is made with an oscillating saw. Size 2.5is the most appropriate size for the tibia and the proximal tibia is prepared with the modular drill and keel punch for that size.      The femoral sizing guide is placed and size 2.5 is most appropriate. Rotation is marked off the epicondylar axis and confirmed by creating a rectangular flexion gap at 90 degrees. The size 2.5 cutting block is pinned in this rotation and the anterior, posterior and chamfer cuts are made with the oscillating saw. The intercondylar block is then placed and that cut is made.      Trial size 2.5 tibial component, trial size 2.5 posterior  stabilized femur and a 10  mm posterior stabilized rotating platform insert trial is placed. Full extension is achieved with excellent varus/valgus and anterior/posterior balance throughout full range of motion. The patella is everted and thickness measured to be 20  mm. Free hand resection is taken to 12 mm, a 35 template is placed, lug holes are drilled, trial patella is placed, and it tracks normally. Osteophytes are removed  off the posterior femur with the trial in place. All trials are removed and the cut bone surfaces prepared with pulsatile lavage. Cement is mixed and once ready for implantation, the size 2.5 tibial implant, size  2.5 posterior stabilized femoral component, and the size 35 patella are cemented in place and the patella is held with the clamp. The trial insert is placed and the knee held in full extension. The Exparel (20 ml mixed with 50 ml saline) is injected into the extensor mechanism, posterior capsule, medial and lateral gutters and subcutaneous tissues.  All extruded cement is removed and once the cement is hard the permanent 10 mm posterior stabilized rotating platform insert is placed into the tibial tray.      The wound is copiously irrigated with saline solution and the extensor mechanism closed over a hemovac drain with #1 PDS suture. The tourniquet is released for a total tourniquet time of 32  minutes. Flexion against gravity is 140 degrees and the patella tracks normally. Subcutaneous tissue is closed with 2.0 vicryl and subcuticular with running 4.0 Monocryl. The incision is cleaned and dried and steri-strips and a bulky sterile dressing are applied. The limb is placed into a knee immobilizer and the patient is awakened and transported to recovery in stable condition.      Please note that a surgical assistant was a medical necessity for this procedure in order to perform it in a safe and expeditious manner. Surgical assistant was necessary to retract the ligaments and vital neurovascular structures to prevent injury to them and also necessary for proper positioning of the limb to allow for anatomic placement of the prosthesis.   Gus Rankin Bianca Kemmerer, MD    07/12/2012, 8:07 AM

## 2012-07-12 NOTE — Progress Notes (Signed)
pacu notes:  Lawyer on for patient comfort only

## 2012-07-12 NOTE — Progress Notes (Signed)
PT Cancellation Note  Patient Details Name: Bianca Williams MRN: [id number] DOB: 08/18/1955   Cancelled Treatment:    Reason Eval/Treat Not Completed: Medical issues which prohibited therapy.  Per RN, pt nauseated and vomiting.  Will defer PT eval until tomorrow.   Thanks,    Vista Deck 07/12/2012, 3:59 PM

## 2012-07-12 NOTE — Interval H&P Note (Signed)
History and Physical Interval Note:  07/12/2012 6:45 AM  Bianca Williams  has presented today for surgery, with the diagnosis of left knee osteoarthritis  The various methods of treatment have been discussed with the patient and family. After consideration of risks, benefits and other options for treatment, the patient has consented to  Procedure(s): TOTAL KNEE ARTHROPLASTY (Left) as a surgical intervention .  The patient's history has been reviewed, patient examined, no change in status, stable for surgery.  I have reviewed the patient's chart and labs.  Questions were answered to the patient's satisfaction.     Loanne Drilling

## 2012-07-12 NOTE — Anesthesia Preprocedure Evaluation (Signed)
Anesthesia Evaluation  Patient identified by MRN, date of birth, ID band Patient awake    Reviewed: Allergy & Precautions, H&P , NPO status , Patient's Chart, lab work & pertinent test results, reviewed documented beta blocker date and time   Airway Mallampati: II TM Distance: >3 FB Neck ROM: full    Dental   Pulmonary neg pulmonary ROS,  breath sounds clear to auscultation        Cardiovascular negative cardio ROS  Rhythm:regular     Neuro/Psych PSYCHIATRIC DISORDERS  Neuromuscular disease    GI/Hepatic negative GI ROS, Neg liver ROS,   Endo/Other  negative endocrine ROS  Renal/GU negative Renal ROS  negative genitourinary   Musculoskeletal   Abdominal   Peds  Hematology negative hematology ROS (+)   Anesthesia Other Findings See surgeon's H&P   Reproductive/Obstetrics negative OB ROS                           Anesthesia Physical Anesthesia Plan  ASA: II  Anesthesia Plan: General   Post-op Pain Management:    Induction: Intravenous  Airway Management Planned: Oral ETT  Additional Equipment:   Intra-op Plan:   Post-operative Plan: Extubation in OR  Informed Consent: I have reviewed the patients History and Physical, chart, labs and discussed the procedure including the risks, benefits and alternatives for the proposed anesthesia with the patient or authorized representative who has indicated his/her understanding and acceptance.   Dental Advisory Given  Plan Discussed with: CRNA and Surgeon  Anesthesia Plan Comments:         Anesthesia Quick Evaluation

## 2012-07-12 NOTE — Plan of Care (Signed)
Problem: Consults Goal: Diagnosis- Total Joint Replacement Primary Total Knee     

## 2012-07-12 NOTE — Transfer of Care (Signed)
Immediate Anesthesia Transfer of Care Note  Patient: Bianca Williams [facility]  Procedure(s) Performed: Procedure(s) (LRB): TOTAL KNEE ARTHROPLASTY (Left)  Patient Location: PACU  Anesthesia Type: General  Level of Consciousness: sedated, patient cooperative and responds to stimulaton  Airway & Oxygen Therapy: Patient Spontanous Breathing and Patient connected to face mask oxgen  Post-op Assessment: Report given to PACU RN and Post -op Vital signs reviewed and stable  Post vital signs: Reviewed and stable  Complications: No apparent anesthesia complications

## 2012-07-12 NOTE — Anesthesia Postprocedure Evaluation (Signed)
Anesthesia Post Note  Patient: Bianca Williams  Procedure(s) Performed: Procedure(s) (LRB): TOTAL KNEE ARTHROPLASTY (Left)  Anesthesia type: Spinal  Patient location: PACU  Post pain: Pain level controlled  Post assessment: Patient's Cardiovascular Status Stable  Last Vitals:  Filed Vitals:   07/12/12 0925  BP:   Pulse: 78  Temp:   Resp: 19    Post vital signs: Reviewed and stable  Level of consciousness: alert  Complications: No apparent anesthesia complications    Motor function returned below T-12

## 2012-07-13 LAB — BASIC METABOLIC PANEL
Calcium: 8.2 mg/dL — ABNORMAL LOW (ref 8.4–10.5)
Chloride: 102 mEq/L (ref 96–112)
Glucose, Bld: 137 mg/dL — ABNORMAL HIGH (ref 70–99)

## 2012-07-13 LAB — CBC
HCT: 26.3 % — ABNORMAL LOW (ref 36.0–46.0)
MCHC: 34.2 g/dL (ref 30.0–36.0)
MCV: 86.5 fL (ref 78.0–100.0)
Platelets: 137 10*3/uL — ABNORMAL LOW (ref 150–400)
RDW: 14.1 % (ref 11.5–15.5)
WBC: 11 10*3/uL — ABNORMAL HIGH (ref 4.0–10.5)

## 2012-07-13 NOTE — Progress Notes (Signed)
Physical Therapy Treatment Patient Details Name: Bianca Williams MRN: [id number] DOB: [birth year] Today's Date: 07/13/2012 Time: 6295-2841 PT Time Calculation (min): 28 min  PT Assessment / Plan / Recommendation Comments on Treatment Session  Progressing slowly with mobility. Pt declined ambulation in hallway this pm due to " not feeling well....nausea". Pt was agreeable to ambulate in room. Recommend HHPT.     Follow Up Recommendations  Home health PT;Supervision for mobility/OOB     Does the patient have the potential to tolerate intense rehabilitation     Barriers to Discharge        Equipment Recommendations  None recommended by PT    Recommendations for Other Services OT consult  Frequency 7X/week   Plan Discharge plan remains appropriate    Precautions / Restrictions Precautions Precautions: Knee Required Braces or Orthoses: Knee Immobilizer - Left Knee Immobilizer - Left: Discontinue once straight leg raise with < 10 degree lag Restrictions Weight Bearing Restrictions: No LLE Weight Bearing: Weight bearing as tolerated   Pertinent Vitals/Pain 8/10 L knee    Mobility  Bed Mobility Bed Mobility: Sit to Supine Sit to Supine: 4: Min assist Details for Bed Mobility Assistance: Assist for L LE onto bed Transfers Transfers: Sit to Stand;Stand to Sit Sit to Stand: 4: Min guard;From chair/3-in-1 Stand to Sit: 4: Min guard;To bed Details for Transfer Assistance: VCs safety, technique, hand placement.  Ambulation/Gait Ambulation/Gait Assistance: 4: Min guard Ambulation Distance (Feet): 15 Feet Assistive device: Rolling walker Ambulation/Gait Assistance Details: VCS safety, technique, sequence. Pt only able to tolerate short ambulation distance in room due to nausea and "not feeling well. I just don't think I can" Gait Pattern: Step-to pattern;Antalgic;Decreased stride length    Exercises Total Joint Exercises Ankle Circles/Pumps: AROM;Both;10 reps;Supine Quad Sets:  AROM;Both;10 reps;Supine Heel Slides: AAROM;Left;10 reps;Supine Hip ABduction/ADduction: AAROM;Left;10 reps;Supine Straight Leg Raises: AAROM;Left;10 reps;Supine   PT Diagnosis: Difficulty walking;Abnormality of gait;Acute pain  PT Problem List: Decreased strength;Decreased range of motion;Decreased activity tolerance;Decreased mobility;Decreased knowledge of use of DME;Decreased knowledge of precautions PT Treatment Interventions: DME instruction;Gait training;Stair training;Functional mobility training;Therapeutic activities;Therapeutic exercise;Patient/family education   PT Goals Acute Rehab PT Goals PT Goal Formulation: With patient Time For Goal Achievement: 07/20/12 Potential to Achieve Goals: Good Pt will go Supine/Side to Sit: with supervision PT Goal: Supine/Side to Sit - Progress: Goal set today Pt will go Sit to Supine/Side: with supervision PT Goal: Sit to Supine/Side - Progress: Progressing toward goal Pt will go Sit to Stand: with supervision PT Goal: Sit to Stand - Progress: Progressing toward goal Pt will Ambulate: 51 - 150 feet;with supervision;with rolling walker PT Goal: Ambulate - Progress: Progressing toward goal Pt will Go Up / Down Stairs: 3-5 stairs;with least restrictive assistive device;with rail(s);with min assist PT Goal: Up/Down Stairs - Progress: Goal set today  Visit Information  Last PT Received On: 07/13/12 Assistance Needed: +1    Subjective Data  Subjective: I just don't feel good....nauseous Patient Stated Goal: home   Cognition       Balance     End of Session PT - End of Session Equipment Utilized During Treatment: Left knee immobilizer Activity Tolerance: Other (comment) (limited by nausea) Patient left: in bed;with call bell/phone within reach   GP     [name] [facility] 07/13/2012, 1:56 PM [phone]

## 2012-07-13 NOTE — Plan of Care (Signed)
Problem: Phase II Progression Outcomes Goal: Ambulates Outcome: Progressing Sat in chair for a few hours, did not walk in hallway

## 2012-07-13 NOTE — Progress Notes (Signed)
   Subjective: 1 Day Post-Op Procedure(s) (LRB): TOTAL KNEE ARTHROPLASTY (Left) Patient reports pain as mild and moderate.  She has also had some nausea last night. Patient seen in rounds with Dr. Lequita Halt. Patient is well, and has had no acute complaints or problems except for the nausea.  PRN meds are ordered. We will start therapy today.  Plan is to go Home after hospital stay.  Objective: Vital signs in last 24 hours: Temp:  [97.4 F (36.3 C)-98.8 F (37.1 C)] 98.8 F (37.1 C) (02/18 0519) Pulse Rate:  [60-82] 65 (02/18 0519) Resp:  [16] 16 (02/18 0519) BP: (106-131)/(62-82) 121/74 mmHg (02/18 0519) SpO2:  [97 %-100 %] 100 % (02/18 0519) Weight:  [53.524 kg (118 lb)] 53.524 kg (118 lb) (02/17 1240)  Intake/Output from previous day:  Intake/Output Summary (Last 24 hours) at 07/13/12 0936 Last data filed at 07/13/12 0810  Gross per 24 hour  Intake 2027.5 ml  Output   2140 ml  Net -112.5 ml    Intake/Output this shift: UOP 700 since MN Total I/O In: 240 [P.O.:240] Out: 250 [Urine:250]  Labs:  Recent Labs  07/13/12 0440  HGB 9.0*    Recent Labs  07/13/12 0440  WBC 11.0*  RBC 3.04*  HCT 26.3*  PLT 137*    Recent Labs  07/13/12 0440  NA 138  K 3.8  CL 102  CO2 27  BUN 10  CREATININE 0.55  GLUCOSE 137*  CALCIUM 8.2*   No results found for this basename: LABPT, INR,  in the last 72 hours  EXAM General - Patient is Alert, Appropriate and Oriented Extremity - Neurovascular intact Sensation intact distally Dorsiflexion/Plantar flexion intact Dressing - dressing C/D/I Motor Function - intact, moving foot and toes well on exam.  Hemovac pulled without difficulty.  Past Medical History  Diagnosis Date  . Fractured     WRIST 12/26/2011 --CAST HAS BEEN REMOVED--STILL SORE AND HEALING  . Osteoporosis   . Fibromyalgia   . Vertigo     HX OF MILD EPISODES  . Anxiety   . Depression     RESOLVED  . Hemorrhoids   . Arthritis   . Memory problem    THOUGHT TO BE RELATED TO LOW VITAMIN B--NOW ON SUPPLEMENT AND HAS NOTICED IMPROVEMENT  . Balance problem     PT ATTRIBUTES TO HER KNEE ARTHRITIS-BONE ON BONE    Assessment/Plan: 1 Day Post-Op Procedure(s) (LRB): TOTAL KNEE ARTHROPLASTY (Left) Active Problems:   Postop Acute blood loss anemia  Estimated body mass index is 22.31 kg/(m^2) as calculated from the following:   Height as of this encounter: 5\' 1"  (1.549 m).   Weight as of this encounter: 53.524 kg (118 lb). Advance diet Up with therapy Discharge home with home health when ready  DVT Prophylaxis - Xarelto Weight-Bearing as tolerated to left leg No vaccines. D/C O2 and Pulse OX and try on Room [address]  Patrica Duel 07/13/2012, 9:36 AM

## 2012-07-13 NOTE — Evaluation (Signed)
Physical Therapy Evaluation Patient Details Name: Bianca Williams MRN: [id number] DOB: 1955-08-13 Today's Date: 07/13/2012 Time: 4098-1191 PT Time Calculation (min): 31 min  PT Assessment / Plan / Recommendation Clinical Impression  57 yo female s/p L TKA. On eval pt was Min assist for transfers. Deferred ambulation due to pt c/o pain, nausea, lightheadedness. BP 97/59 sitting EOB. Recommend HHPT to improve strength, activity tolerance, ROM, and gait in order to maximize independence    PT Assessment  Patient needs continued PT services    Follow Up Recommendations  Home health PT    Does the patient have the potential to tolerate intense rehabilitation      Barriers to Discharge        Equipment Recommendations  None recommended by PT    Recommendations for Other Services OT consult   Frequency 7X/week    Precautions / Restrictions Precautions Precautions: Knee Required Braces or Orthoses: Knee Immobilizer - Left Knee Immobilizer - Left: Discontinue once straight leg raise with < 10 degree lag Restrictions Weight Bearing Restrictions: No LLE Weight Bearing: Weight bearing as tolerated   Pertinent Vitals/Pain 7/10 L knee      Mobility  Bed Mobility Bed Mobility: Supine to Sit Supine to Sit: 4: Min assist Details for Bed Mobility Assistance: Assist for LLE off bed.  Transfers Transfers: Sit to Stand;Stand to [employer] Transfers Sit to Stand: 4: Min assist;From bed Stand to Sit: 4: Min assist;To chair/3-in-1 Details for Transfer Assistance: Assist to rise, stabilize, control descent. VCs safety, technique, hand placement.  Ambulation/Gait Ambulation/Gait Assistance: Not tested (comment) Ambulation/Gait Assistance Details: Deferred due to pt c/o pain,lightheadedness, nausea. BP 97/59 Gait Pattern: Step-to pattern;Decreased stance time - left;Antalgic    Exercises     PT Diagnosis: Difficulty walking;Abnormality of gait;Acute pain  PT Problem List:  Decreased strength;Decreased range of motion;Decreased activity tolerance;Decreased mobility;Decreased knowledge of use of DME;Decreased knowledge of precautions PT Treatment Interventions: DME instruction;Gait training;Stair training;Functional mobility training;Therapeutic activities;Therapeutic exercise;Patient/family education   PT Goals Acute Rehab PT Goals PT Goal Formulation: With patient Time For Goal Achievement: 07/20/12 Potential to Achieve Goals: Good Pt will go Supine/Side to Sit: with supervision PT Goal: Supine/Side to Sit - Progress: Goal set today Pt will go Sit to Supine/Side: with supervision PT Goal: Sit to Supine/Side - Progress: Goal set today Pt will go Sit to Stand: with supervision PT Goal: Sit to Stand - Progress: Goal set today Pt will Ambulate: 51 - 150 feet;with supervision;with rolling walker PT Goal: Ambulate - Progress: Goal set today Pt will Go Up / Down Stairs: 3-5 stairs;with least restrictive assistive device;with rail(s);with min assist PT Goal: Up/Down Stairs - Progress: Goal set today  Visit Information  Last PT Received On: 07/13/12 Assistance Needed: +1    Subjective Data  Subjective: I feel lightheaded Patient Stated Goal: home   Prior Functioning  Home Living Lives With: Spouse Type of Home: House Home Access: Stairs to enter [employer] of Steps: 4 in back and then pt is on main level Entrance Stairs-Rails: Right;Left Home Layout: Two level Home Adaptive Equipment: Walker - rolling;Shower chair with back;Bedside commode/3-in-1;Other (comment) (platform L ) Prior Function Level of Independence: Independent Able to Take Stairs?: Yes Communication Communication: No difficulties    Cognition  Cognition Overall Cognitive Status: Appears within functional limits for tasks assessed/performed Arousal/Alertness: Awake/alert Orientation Level: Appears intact for tasks assessed Behavior During Session: [facility] for tasks performed     Extremity/Trunk Assessment Right Lower Extremity Assessment RLE ROM/Strength/Tone: [facility] for  tasks assessed Left Lower Extremity Assessment LLE ROM/Strength/Tone: Deficits LLE ROM/Strength/Tone Deficits: moves ankle well. SLR 2/5 Trunk Assessment Trunk Assessment: Normal   Balance    End of Session PT - End of Session Equipment Utilized During Treatment: Left knee immobilizer Activity Tolerance: Patient limited by pain (limited by nausea, lightheadedness) Patient left: in chair;with call bell/phone within reach  GP     [name] [facility] 07/13/2012, 10:11 AM [phone]

## 2012-07-14 LAB — CBC
Hemoglobin: 8.4 g/dL — ABNORMAL LOW (ref 12.0–15.0)
MCH: 29.6 pg (ref 26.0–34.0)
MCHC: 33.6 g/dL (ref 30.0–36.0)
MCV: 88 fL (ref 78.0–100.0)
RBC: 2.84 MIL/uL — ABNORMAL LOW (ref 3.87–5.11)

## 2012-07-14 LAB — BASIC METABOLIC PANEL
BUN: 7 mg/dL (ref 6–23)
CO2: 30 mEq/L (ref 19–32)
Calcium: 8.3 mg/dL — ABNORMAL LOW (ref 8.4–10.5)
Creatinine, Ser: 0.53 mg/dL (ref 0.50–1.10)
GFR calc non Af Amer: >90 mL/min (ref >90)
Glucose, Bld: 103 mg/dL — ABNORMAL HIGH (ref 70–99)
Sodium: 141 mEq/L (ref 135–145)

## 2012-07-14 MED ORDER — METHOCARBAMOL 500 MG PO TABS: 500.0000 mg | ORAL_TABLET | Freq: Four times a day (QID) | ORAL | Status: DC | PRN

## 2012-07-14 MED ORDER — TRAMADOL HCL 50 MG PO TABS: 50.0000 mg | ORAL_TABLET | Freq: Four times a day (QID) | ORAL | Status: DC | PRN

## 2012-07-14 MED ORDER — RIVAROXABAN 10 MG PO TABS: 10.0000 mg | ORAL_TABLET | Freq: Every day | ORAL | Status: DC

## 2012-07-14 MED ORDER — HYDROMORPHONE HCL 2 MG PO TABS: 2.0000 mg | ORAL_TABLET | ORAL | Status: DC | PRN

## 2012-07-14 MED ORDER — ONDANSETRON HCL 4 MG PO TABS: 4.0000 mg | ORAL_TABLET | Freq: Four times a day (QID) | ORAL | Status: DC | PRN

## 2012-07-14 NOTE — Progress Notes (Signed)
Physical Therapy Treatment Patient Details Name: Bianca Williams MRN: [id number] DOB: 1956/03/04 Today's Date: 07/14/2012 Time: 4098-1191 PT Time Calculation (min): 29 min  PT Assessment / Plan / Recommendation Comments on Treatment Session  2nd tx session. Completed all education. Practiced steps with family. Instructed pt to perform exercises 2-3x/day until OP PT begins. Pt would like to d/c home however MD has not made decision on if pt is to d/c home today. Pt is ready for d/c home from PT standpoint. RN made aware.     Follow Up Recommendations  Outpatient PT (pt has already arranged this);Supervision for mobility/OOB     Does the patient have the potential to tolerate intense rehabilitation     Barriers to Discharge        Equipment Recommendations  None recommended by PT    Recommendations for Other Services OT consult  Frequency 7X/week   Plan Discharge plan remains appropriate    Precautions / Restrictions Precautions Precautions: Knee Required Braces or Orthoses: Knee Immobilizer - Left Knee Immobilizer - Left: Discontinue once straight leg raise with < 10 degree lag Restrictions Weight Bearing Restrictions: No LLE Weight Bearing: Weight bearing as tolerated   Pertinent Vitals/Pain 5/10 L knee    Mobility  Bed Mobility Bed Mobility: Not assessed Transfers Transfers: Sit to Stand;Stand to Sit Sit to Stand: 4: Min guard;From chair/3-in-1 Stand to Sit: 4: Min guard;To chair/3-in-1 Details for Transfer Assistance: VCs safety, technique, hand placement.  Ambulation/Gait Ambulation/Gait Assistance: 4: Min guard Ambulation Distance (Feet): 125 feet (75' x 1, 50'x1) Assistive device: Rolling walker Ambulation/Gait Assistance Details: Pt c/o some minor L wrist discomfort.  Gait Pattern: Antalgic;Decreased stride length;Step-to pattern Stairs: Yes Stairs Assistance: 4: Min assist Stairs Assistance Details (indicate cue type and reason): Attempted to educate pt on  stair negotiation with 1 crutch, 1 rail however pt and husband declined this technique. Preferred to have pt use 1 rail and husband's arm. Practiced x 3-pt performed well.  Stair Management Technique: Forwards;One rail Right Number of Stairs: 12 (4x3)    Exercises    PT Diagnosis:    PT Problem List:   PT Treatment Interventions:     PT Goals Acute Rehab PT Goals Pt will go Sit to Stand: with supervision PT Goal: Sit to Stand - Progress: Progressing toward goal Pt will Ambulate: 51 - 150 feet;with supervision;with rolling walker PT Goal: Ambulate - Progress: Progressing toward goal Pt will Go Up / Down Stairs: 3-5 stairs;with least restrictive assistive device;with rail(s);with min assist PT Goal: Up/Down Stairs - Progress: Met  Visit Information  Last PT Received On: 07/14/12 Assistance Needed: +1    Subjective Data  Subjective: I wanna go home today Patient Stated Goal: home   Cognition       Balance     End of Session PT - End of Session Equipment Utilized During Treatment: Left knee immobilizer;Gait belt Activity Tolerance: Patient tolerated treatment well Patient left: in chair;with family/visitor present CPM Left Knee CPM Left Knee: Off   GP     [name] [facility] 07/14/2012, 2:54 PM [phone]

## 2012-07-14 NOTE — Progress Notes (Signed)
   Subjective: 2 Days Post-Op Procedure(s) (LRB): TOTAL KNEE ARTHROPLASTY (Left) Patient reports pain as mild.   Patient seen in rounds with Dr. Lequita Halt.  She has had some nausea. Patient is well, but has had some minor complaints of pain in the knee, requiring pain medications Plan is to go Home after hospital stay.  Objective: Vital signs in last 24 hours: Temp:  [98.2 F (36.8 C)-99.1 F (37.3 C)] 98.2 F (36.8 C) (02/19 0904) Pulse Rate:  [54-90] 75 (02/19 0904) Resp:  [16-17] 17 (02/19 0904) BP: (94-115)/(57-71) 110/57 mmHg (02/19 0904) SpO2:  [97 %-99 %] 97 % (02/19 0904)  Intake/Output from previous day:  Intake/Output Summary (Last 24 hours) at 07/14/12 1027 Last data filed at 07/14/12 0904  Gross per 24 hour  Intake 1521.67 ml  Output   2200 ml  Net -678.33 ml    Intake/Output this shift: Total I/O In: -  Out: 100 [Urine:100]  Labs:  Recent Labs  07/13/12 0440 07/14/12 0435  HGB 9.0* 8.4*    Recent Labs  07/13/12 0440 07/14/12 0435  WBC 11.0* 12.3*  RBC 3.04* 2.84*  HCT 26.3* 25.0*  PLT 137* 144*    Recent Labs  07/13/12 0440 07/14/12 0435  NA 138 141  K 3.8 3.5  CL 102 104  CO2 27 30  BUN 10 7  CREATININE 0.55 0.53  GLUCOSE 137* 103*  CALCIUM 8.2* 8.3*   No results found for this basename: LABPT, INR,  in the last 72 hours  EXAM General - Patient is Alert, Appropriate and Oriented Extremity - Neurovascular intact Sensation intact distally Dorsiflexion/Plantar flexion intact No cellulitis present Dressing/Incision - clean, dry, no drainage, healing Motor Function - intact, moving foot and toes well on exam.   Past Medical History  Diagnosis Date  . Fractured     WRIST 12/26/2011 --CAST HAS BEEN REMOVED--STILL SORE AND HEALING  . Osteoporosis   . Fibromyalgia   . Vertigo     HX OF MILD EPISODES  . Anxiety   . Depression     RESOLVED  . Hemorrhoids   . Arthritis   . Memory problem     THOUGHT TO BE RELATED TO LOW VITAMIN  B--NOW ON SUPPLEMENT AND HAS NOTICED IMPROVEMENT  . Balance problem     PT ATTRIBUTES TO HER KNEE ARTHRITIS-BONE ON BONE    Assessment/Plan: 2 Days Post-Op Procedure(s) (LRB): TOTAL KNEE ARTHROPLASTY (Left) Active Problems:   Postop Acute blood loss anemia  Estimated body mass index is 22.31 kg/(m^2) as calculated from the following:   Height as of this encounter: 5\' 1"  (1.549 m).   Weight as of this encounter: 53.524 kg (118 lb). Up with therapy Plan for discharge tomorrow Discharge home with home health.  Maybe today if does well and nausea improved.  DVT Prophylaxis - Xarelto Weight-Bearing as tolerated to left leg  Amere Iott 07/14/2012, 10:27 AM

## 2012-07-14 NOTE — Progress Notes (Addendum)
Physical Therapy Treatment Patient Details Name: Bianca Williams MRN: [id number] DOB: [birth year] Today's Date: 07/14/2012 Time: 0347-4259 PT Time Calculation (min): 26 min  PT Assessment / Plan / Recommendation Comments on Treatment Session  Progressing with mobility. Tolerated ambulation in hallway. Increased pain with ROM exercises. Pt reports she would like to d/c home later today. Recommended HHPT initially however pt prefers to have follow-up therapy in outpatient setting. Will have 2nd session to practice more ambulation and stairs.     Follow Up Recommendations  Outpatient PT (pt has already arranged this);Supervision for mobility/OOB     Does the patient have the potential to tolerate intense rehabilitation     Barriers to Discharge        Equipment Recommendations  None recommended by PT    Recommendations for Other Services OT consult  Frequency 7X/week   Plan Discharge plan remains appropriate    Precautions / Restrictions Precautions Precautions: Knee Required Braces or Orthoses: Knee Immobilizer - Left Knee Immobilizer - Left: Discontinue once straight leg raise with < 10 degree lag Restrictions Weight Bearing Restrictions: No LLE Weight Bearing: Weight bearing as tolerated   Pertinent Vitals/Pain 5/10 L knee with ambulation. Increased pain with exercises-unrated.    Mobility  Bed Mobility Bed Mobility: Not assessed Supine to Sit: 5: Supervision;With rails Details for Bed Mobility Assistance: assist to get L LE off bed Transfers Transfers: Sit to Stand;Stand to Sit Sit to Stand: 4: Min guard Stand to Sit: 4: Min guard;To chair/3-in-1 Details for Transfer Assistance: VCs safety, technique, hand placement.  Ambulation/Gait Ambulation/Gait Assistance: 4: Min guard Ambulation Distance (Feet): 75 Feet Assistive device: Rolling walker Ambulation/Gait Assistance Details: VCS safety, technique, sequence. slow gait speed. Pt did c/o some minor L wrist pain but  did not feel she needed platform put back on walker just yet.  Gait Pattern: Step-to pattern;Antalgic;Decreased stride length    Exercises Total Joint Exercises Ankle Circles/Pumps: AROM;Both;10 reps;Seated Quad Sets: AROM;Both;10 reps;Seated Short Arc Quad: AAROM;Left;10 reps;Seated Heel Slides: AAROM;Left;10 reps;Seated Hip ABduction/ADduction: AAROM;Left;10 reps;Seated Straight Leg Raises: AAROM;Left;10 reps;Seated   PT Diagnosis:    PT Problem List:   PT Treatment Interventions:     PT Goals Acute Rehab PT Goals Pt will go Sit to Stand: with supervision PT Goal: Sit to Stand - Progress: Progressing toward goal Pt will Ambulate: 51 - 150 feet;with supervision;with rolling walker PT Goal: Ambulate - Progress: Progressing toward goal  Visit Information  Last PT Received On: 07/14/12 Assistance Needed: +1    Subjective Data  Subjective: I wanna go home today Patient Stated Goal: home   Cognition  Cognition Overall Cognitive Status: Appears within functional limits for tasks assessed/performed Arousal/Alertness: Awake/Williams Orientation Level: Appears intact for tasks assessed Behavior During Session: [facility] for tasks performed    Balance  Balance Balance Assessed: No  End of Session PT - End of Session Equipment Utilized During Treatment: Left knee immobilizer Activity Tolerance: Patient limited by pain Patient left: in chair;with call bell/phone within reach;with family/visitor present CPM Left Knee CPM Left Knee: Off   GP     Bianca Williams [facility] 07/14/2012, 11:35 AM [phone]

## 2012-07-14 NOTE — Care Management Note (Signed)
    Page 1 of 2   07/14/2012     5:43:04 PM   CARE MANAGEMENT NOTE 07/14/2012  Patient:  Bianca Williams, Bianca Williams   Account Number:  [id number]  Date Initiated:  07/13/2012  Documentation initiated by:  Colleen Can  Subjective/Objective Assessment:   dx osteoarthritis left knee; total knee replacemnt on day of admission.    Pt has pre-arranged to have outpatient physical at physical clinic in [facility] will transport.     Action/Plan:   CM spoke with patient. Pt is planning to discharge to her home where her spouse and others family members will  be caregivers. She states she has only 9 HH pt visits left on her insurance plan so she has arranged to do outpatient physical.   Anticipated DC Date:  07/15/2012   Anticipated DC Plan:  HOME/SELF CARE  In-house referral  NA      DC Planning Services  CM consult      PAC Choice  NA   Choice offered to / List presented to:  NA   DME arranged  NA      DME agency  NA     HH arranged  NA      HH agency  NA   Status of service:  Completed, signed off Medicare Important Message given?  NO (If response is "NO", the following Medicare IM given date fields will be blank) Date Medicare IM given:   Date Additional Medicare IM given:    Discharge Disposition:    Per UR Regulation:  Reviewed for med. necessity/level of care/duration of stay  If discussed at Long Length of Stay Meetings, dates discussed:    Comments:  07/14/2012 Colleen Can BSN RN CCM [phone] Pt already has DME from previous surgery.

## 2012-07-14 NOTE — Evaluation (Signed)
Occupational Therapy Evaluation Patient Details Name: Bianca Williams MRN: [id number] DOB: 15-Oct-1955 Today's Date: 07/14/2012 Time: 4098-1191 OT Time Calculation (min): 24 min  OT Assessment / Plan / Recommendation Clinical Impression  PT is a 57 yo female admitted for L TKA with deficits listed below.  Pt will be d/cing home with daughter to assist but would benefit from cont OT to increase I with all basic adls.      OT Assessment  Patient needs continued OT Services    Follow Up Recommendations  No OT follow up    Barriers to Discharge None    Equipment Recommendations       Recommendations for Other Services    Frequency  Min 2X/week    Precautions / Restrictions Precautions Precautions: Knee Required Braces or Orthoses: Knee Immobilizer - Left Knee Immobilizer - Left: Discontinue once straight leg raise with < 10 degree lag Restrictions Weight Bearing Restrictions: No LLE Weight Bearing: Weight bearing as tolerated   Pertinent Vitals/Pain Pt with 5/10 pain in knee    ADL  Eating/Feeding: Performed;Independent Where Assessed - Eating/Feeding: Edge of bed Grooming: Performed;Wash/dry hands;Wash/dry face;Supervision/safety Where Assessed - Grooming: Supported standing Upper Body Bathing: Simulated;Set up Where Assessed - Upper Body Bathing: Unsupported sitting Lower Body Bathing: Simulated;Minimal assistance Where Assessed - Lower Body Bathing: Supported sit to stand Upper Body Dressing: Performed;Independent Where Assessed - Upper Body Dressing: Unsupported sitting Lower Body Dressing: Simulated;Moderate assistance Where Assessed - Lower Body Dressing: Supported sit to Pharmacist, hospital: Research scientist (life sciences) Method: Sit to Barista: Comfort height toilet;Grab bars Toileting - Architect and Hygiene: Performed;Supervision/safety Where Assessed - Engineer, mining and Hygiene:  Standing Equipment Used: Rolling walker;Knee Immobilizer Transfers/Ambulation Related to ADLs: Pt walked from bed to bathroom to chair with S. ADL Comments: Pt needs more assist with LE adls due to immobilizer and sore knee.    OT Diagnosis: Acute pain;Generalized weakness  OT Problem List: Decreased knowledge of use of DME or AE;Decreased knowledge of precautions;Pain OT Treatment Interventions: Self-care/ADL training;DME and/or AE instruction   OT Goals Acute Rehab OT Goals OT Goal Formulation: With patient Time For Goal Achievement: 07/21/12 Potential to Achieve Goals: Good ADL Goals Pt Will Perform Grooming: with modified independence;Standing at sink ADL Goal: Grooming - Progress: Goal set today Pt Will Perform Lower Body Bathing: with supervision;Sit to stand from chair ADL Goal: Lower Body Bathing - Progress: Goal set today Pt Will Perform Lower Body Dressing: with modified independence;Sit to stand from chair ADL Goal: Lower Body Dressing - Progress: Goal set today Pt Will Perform Tub/Shower Transfer: Shower transfer;with supervision;Shower seat with back ADL Goal: Web designer - Progress: Goal set today Additional ADL Goal #1: Pt woll complete all aspects of toileting with mod I with 3:1 over commode. ADL Goal: Additional Goal #1 - Progress: Goal set today  Visit Information  Last OT Received On: 07/14/12 Assistance Needed: +1    Subjective Data  Subjective: "It is just this nausea." Patient Stated Goal: to go home   Prior Functioning     Home Living Lives With: Spouse Available Help at Discharge: Family;Available 24 hours/day;Other (Comment) (daughter visiting from [place]) Type of Home: House Home Access: Stairs to enter [employer] of Steps: 4 in back and then pt is on main level Entrance Stairs-Rails: Right;Left Home Layout: Two level Bathroom Shower/Tub: Walk-in shower;Door [employer] Toilet: Standard Home Adaptive Equipment: Walker -  rolling;Shower chair with back;Bedside commode/3-in-1;Other (comment) Prior Function Level of Independence: Independent Able  to Take Stairs?: Yes Driving: Yes Vocation: Full time employment 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 Communication Communication: No difficulties Dominant Hand: Right         Vision/Perception Vision - History Baseline Vision: No visual deficits Patient Visual Report: No change from baseline Vision - Assessment Vision Assessment: Vision not tested   [employer] Overall Cognitive Status: Appears within functional limits for tasks assessed/performed Arousal/Alertness: Awake/alert Orientation Level: Appears intact for tasks assessed Behavior During Session: [facility] for tasks performed    Extremity/Trunk Assessment Right Upper Extremity Assessment RUE ROM/Strength/Tone: Within functional levels RUE Sensation: WFL - Light Touch RUE Coordination: WFL - gross/fine motor Left Upper Extremity Assessment LUE ROM/Strength/Tone: Within functional levels LUE Sensation: WFL - Light Touch LUE Coordination: WFL - gross/fine motor Trunk Assessment Trunk Assessment: Normal     Mobility Bed Mobility Bed Mobility: Sit to Supine;Supine to Sit Supine to Sit: 5: Supervision;With rails Details for Bed Mobility Assistance: assist to get L LE off bed Transfers Transfers: Sit to Stand;Stand to Sit Sit to Stand: 4: Min guard;From chair/3-in-1 Stand to Sit: 4: Min guard;To bed Details for Transfer Assistance: VCs safety, technique, hand placement.      Exercise     Balance Balance Balance Assessed: No   End of Session OT - End of Session Equipment Utilized During Treatment: Right knee immobilizer Activity Tolerance: Patient tolerated treatment well Patient left: in chair;with call bell/phone within reach;Other (comment) (PT in room) Nurse Communication: Mobility status CPM Left Knee CPM Left Knee: Off  GO     Hope Budds 07/14/2012,  11:03 AM [phone]

## 2012-07-29 NOTE — Discharge Summary (Signed)
Physician Discharge Summary   Patient ID: Bianca Williams MRN: [id number] DOB/AGE: 59-03-1956 59 y.o.  Admit date: 07/12/2012 Discharge date: 07/14/2012  Primary Diagnosis:  Osteoarthritis Left knee  Admission Diagnoses:  Past Medical History  Diagnosis Date  . Fractured     WRIST 12/26/2011 --CAST HAS BEEN REMOVED--STILL SORE AND HEALING  . Osteoporosis   . Fibromyalgia   . Vertigo     HX OF MILD EPISODES  . Anxiety   . Depression     RESOLVED  . Hemorrhoids   . Arthritis   . Memory problem     THOUGHT TO BE RELATED TO LOW VITAMIN B--NOW ON SUPPLEMENT AND HAS NOTICED IMPROVEMENT  . Balance problem     PT ATTRIBUTES TO HER KNEE ARTHRITIS-BONE ON BONE   Discharge Diagnoses:   Active Problems:   Postop Acute blood loss anemia  Estimated body mass index is 22.31 kg/(m^2) as calculated from the following:   Height as of this encounter: 5\' 1"  (1.549 m).   Weight as of this encounter: 53.524 kg (118 lb).  Procedure:  Procedure(s) (LRB): TOTAL KNEE ARTHROPLASTY (Left)   Consults: None  HPI: Bianca Williams is a 57 y.o. year old female with end stage OA of her left knee with progressively worsening pain and dysfunction. She has constant pain, with activity and at rest and significant functional deficits with difficulties even with ADLs. She has had extensive non-op management including analgesics, injections of cortisone, and home exercise program, but remains in significant pain with significant dysfunction. Radiographs show bone on bone arthritis medial and patellofemoral. She presents now for left Total Knee Arthroplasty.   Laboratory Data: Admission on 07/12/2012, Discharged on 07/14/2012  Component Date Value Range Status  . ABO/RH(D) 07/12/2012 O POS   Final  . Antibody Screen 07/12/2012 NEG   Final  . Sample Expiration 07/12/2012 07/15/2012   Final  . WBC 07/13/2012 11.0* 4.0 - 10.5 K/uL Final  . RBC 07/13/2012 3.04* 3.87 - 5.11 MIL/uL Final  . Hemoglobin  07/13/2012 9.0* 12.0 - 15.0 g/dL Final  . HCT 21/30/8657 26.3* 36.0 - 46.0 % Final  . MCV 07/13/2012 86.5  78.0 - 100.0 fL Final  . MCH 07/13/2012 29.6  26.0 - 34.0 pg Final  . MCHC 07/13/2012 34.2  30.0 - 36.0 g/dL Final  . RDW 84/69/6295 14.1  11.5 - 15.5 % Final  . Platelets 07/13/2012 137* 150 - 400 K/uL Final  . Sodium 07/13/2012 138  135 - 145 mEq/L Final  . Potassium 07/13/2012 3.8  3.5 - 5.1 mEq/L Final  . Chloride 07/13/2012 102  96 - 112 mEq/L Final  . CO2 07/13/2012 27  19 - 32 mEq/L Final  . Glucose, Bld 07/13/2012 137* 70 - 99 mg/dL Final  . BUN 28/41/3244 10  6 - 23 mg/dL Final  . Creatinine, Ser 07/13/2012 0.55  0.50 - 1.10 mg/dL Final  . Calcium 05/28/7251 8.2* 8.4 - 10.5 mg/dL Final  . GFR calc non Af Amer 07/13/2012 >90  >90 mL/min Final  . GFR calc Af Amer 07/13/2012 >90  >90 mL/min Final   Comment:                                 The eGFR has been calculated                          using the CKD EPI  equation.                          This calculation has not been                          validated in all clinical                          situations.                          eGFR's persistently                          <90 mL/min signify                          possible Chronic Kidney Disease.  . WBC 07/14/2012 12.3* 4.0 - 10.5 K/uL Final  . RBC 07/14/2012 2.84* 3.87 - 5.11 MIL/uL Final  . Hemoglobin 07/14/2012 8.4* 12.0 - 15.0 g/dL Final  . HCT 16/02/9603 25.0* 36.0 - 46.0 % Final  . MCV 07/14/2012 88.0  78.0 - 100.0 fL Final  . MCH 07/14/2012 29.6  26.0 - 34.0 pg Final  . MCHC 07/14/2012 33.6  30.0 - 36.0 g/dL Final  . RDW 54/01/8118 14.2  11.5 - 15.5 % Final  . Platelets 07/14/2012 144* 150 - 400 K/uL Final  . Sodium 07/14/2012 141  135 - 145 mEq/L Final  . Potassium 07/14/2012 3.5  3.5 - 5.1 mEq/L Final  . Chloride 07/14/2012 104  96 - 112 mEq/L Final  . CO2 07/14/2012 30  19 - 32 mEq/L Final  . Glucose, Bld 07/14/2012 103* 70 - 99 mg/dL Final  . BUN  14/78/2956 7  6 - 23 mg/dL Final  . Creatinine, Ser 07/14/2012 0.53  0.50 - 1.10 mg/dL Final  . Calcium 21/30/8657 8.3* 8.4 - 10.5 mg/dL Final  . GFR calc non Af Amer 07/14/2012 >90  >90 mL/min Final  . GFR calc Af Amer 07/14/2012 >90  >90 mL/min Final   Comment:                                 The eGFR has been calculated                          using the CKD EPI equation.                          This calculation has not been                          validated in all clinical                          situations.                          eGFR's persistently                          <90 mL/min signify  possible Chronic Kidney Disease.  Hospital Outpatient Visit on 07/02/2012  Component Date Value Range Status  . aPTT 07/02/2012 27  24 - 37 seconds Final  . WBC 07/02/2012 8.3  4.0 - 10.5 K/uL Final  . RBC 07/02/2012 4.84  3.87 - 5.11 MIL/uL Final  . Hemoglobin 07/02/2012 13.9  12.0 - 15.0 g/dL Final  . HCT 16/02/9603 41.7  36.0 - 46.0 % Final  . MCV 07/02/2012 86.2  78.0 - 100.0 fL Final  . MCH 07/02/2012 28.7  26.0 - 34.0 pg Final  . MCHC 07/02/2012 33.3  30.0 - 36.0 g/dL Final  . RDW 54/01/8118 13.7  11.5 - 15.5 % Final  . Platelets 07/02/2012 220  150 - 400 K/uL Final  . Sodium 07/02/2012 140  135 - 145 mEq/L Final  . Potassium 07/02/2012 4.6  3.5 - 5.1 mEq/L Final  . Chloride 07/02/2012 102  96 - 112 mEq/L Final  . CO2 07/02/2012 31  19 - 32 mEq/L Final  . Glucose, Bld 07/02/2012 98  70 - 99 mg/dL Final  . BUN 14/78/2956 23  6 - 23 mg/dL Final  . Creatinine, Ser 07/02/2012 0.58  0.50 - 1.10 mg/dL Final  . Calcium 21/30/8657 8.9  8.4 - 10.5 mg/dL Final  . Total Protein 07/02/2012 6.6  6.0 - 8.3 g/dL Final  . Albumin 84/69/6295 3.8  3.5 - 5.2 g/dL Final  . AST 28/41/3244 34  0 - 37 U/L Final  . ALT 07/02/2012 31  0 - 35 U/L Final  . Alkaline Phosphatase 07/02/2012 92  39 - 117 U/L Final  . Total Bilirubin 07/02/2012 1.2  0.3 - 1.2 mg/dL Final  . GFR  calc non Af Amer 07/02/2012 >90  >90 mL/min Final  . GFR calc Af Amer 07/02/2012 >90  >90 mL/min Final   Comment:                                 The eGFR has been calculated                          using the CKD EPI equation.                          This calculation has not been                          validated in all clinical                          situations.                          eGFR's persistently                          <90 mL/min signify                          possible Chronic Kidney Disease.  Marland Kitchen Prothrombin Time 07/02/2012 13.0  11.6 - 15.2 seconds Final  . INR 07/02/2012 0.99  0.00 - 1.49 Final  . Color, Urine 07/02/2012 YELLOW  YELLOW Final  . APPearance 07/02/2012 CLEAR  CLEAR Final  . Specific Gravity, Urine 07/02/2012 1.021  1.005 - 1.030  Final  . pH 07/02/2012 5.5  5.0 - 8.0 Final  . Glucose, UA 07/02/2012 NEGATIVE  NEGATIVE mg/dL Final  . Hgb urine dipstick 07/02/2012 NEGATIVE  NEGATIVE Final  . Bilirubin Urine 07/02/2012 NEGATIVE  NEGATIVE Final  . Ketones, ur 07/02/2012 NEGATIVE  NEGATIVE mg/dL Final  . Protein, ur 16/02/9603 NEGATIVE  NEGATIVE mg/dL Final  . Urobilinogen, UA 07/02/2012 0.2  0.0 - 1.0 mg/dL Final  . Nitrite 54/01/8118 NEGATIVE  NEGATIVE Final  . Leukocytes, UA 07/02/2012 NEGATIVE  NEGATIVE Final   MICROSCOPIC NOT DONE ON URINES WITH NEGATIVE PROTEIN, BLOOD, LEUKOCYTES, NITRITE, OR GLUCOSE <1000 mg/dL.  Marland Kitchen MRSA, PCR 07/02/2012 NEGATIVE  NEGATIVE Final  . Staphylococcus aureus 07/02/2012 NEGATIVE  NEGATIVE Final   Comment:                                 The Xpert SA Assay (FDA                          approved for NASAL specimens                          in patients over 64 years of age),                          is one component of                          a comprehensive surveillance                          program.  Test performance has                          been validated by [employer] for patients  greater                          than or equal to 42 year old.                          It is not intended                          to diagnose infection nor to                          guide or monitor treatment.     X-Rays:No results found.  EKG:No orders found for this or any previous visit.   Hospital Course: Bianca Williams is a 57 y.o. who was admitted to [facility]. They were brought to the operating room on 07/12/2012 and underwent Procedure(s): TOTAL KNEE ARTHROPLASTY.  Patient tolerated the procedure well and was later transferred to the recovery room and then to the orthopaedic floor for postoperative care.  They were given PO and IV analgesics for pain control following their surgery.  They were given 24 hours of postoperative antibiotics of  Anti-infectives   Start     Dose/Rate Route Frequency  Ordered Stop   07/12/12 1930  vancomycin (VANCOCIN) IVPB 1000 mg/200 mL premix     1,000 mg 200 mL/hr over 60 Minutes Intravenous Every 12 hours 07/12/12 1000 07/12/12 2007   07/12/12 0513  vancomycin (VANCOCIN) 1,500 mg in sodium chloride 0.9 % 500 mL IVPB     1,500 mg 250 mL/hr over 120 Minutes Intravenous On call to O.R. 07/12/12 1610 07/12/12 0715     and started on DVT prophylaxis in the form of Xarelto.   PT and OT were ordered for total joint protocol.  Discharge planning consulted to help with postop disposition and equipment needs.  Patient had a tough night on the evening of surgery.  They started to get up OOB with therapy on day one.  She did have some nausea also.  Hemovac drain was pulled without difficulty.  Continued to work with therapy into day two despite some continued bouts of intermittent nausea.  Dressing was changed on day two and the incision was healing well.  Patient was seen in rounds and did improved throughout POD 2 and felt better later that day.  She was then ready to go home.   Discharge Medications: Prior to Admission medications   Medication  Sig Start Date End Date Taking? Authorizing Provider  sodium chloride (MURO 128) 5 % ophthalmic ointment Place 1 drop into both eyes at bedtime.   Yes Historical Provider, MD  HYDROmorphone (DILAUDID) 2 MG tablet Take 1-2 tablets (2-4 mg total) by mouth every 4 (four) hours as needed. 07/14/12   Loanne Drilling, MD  methocarbamol (ROBAXIN) 500 MG tablet Take 1 tablet (500 mg total) by mouth every 6 (six) hours as needed. 07/14/12   Loanne Drilling, MD  ondansetron (ZOFRAN) 4 MG tablet Take 1 tablet (4 mg total) by mouth every 6 (six) hours as needed for nausea. 07/14/12   Loanne Drilling, MD  rivaroxaban (XARELTO) 10 MG TABS tablet Take 1 tablet (10 mg total) by mouth daily with breakfast. 07/14/12   Loanne Drilling, MD  traMADol (ULTRAM) 50 MG tablet Take 1-2 tablets (50-100 mg total) by mouth every 6 (six) hours as needed. 07/14/12   Loanne Drilling, MD    Diet: Regular diet Activity:WBAT Follow-up:in 2 weeks Disposition - Home Discharged Condition: Improving      Medication List    STOP taking these medications       calcium carbonate 500 MG chewable tablet  Commonly known as:  TUMS - dosed in mg elemental calcium     cholecalciferol 1000 UNITS tablet  Commonly known as:  VITAMIN D     multivitamin with minerals Tabs     OVER THE COUNTER MEDICATION     vitamin B-12 1000 MCG tablet  Commonly known as:  CYANOCOBALAMIN      TAKE these medications       HYDROmorphone 2 MG tablet  Commonly known as:  DILAUDID  Take 1-2 tablets (2-4 mg total) by mouth every 4 (four) hours as needed.     methocarbamol 500 MG tablet  Commonly known as:  ROBAXIN  Take 1 tablet (500 mg total) by mouth every 6 (six) hours as needed.     MURO 128 5 % ophthalmic ointment  Generic drug:  sodium chloride  Place 1 drop into both eyes at bedtime.     ondansetron 4 MG tablet  Commonly known as:  ZOFRAN  Take 1 tablet (4 mg total) by mouth every 6 (six) hours as needed for  nausea.     rivaroxaban 10  MG Tabs tablet  Commonly known as:  XARELTO  Take 1 tablet (10 mg total) by mouth daily with breakfast.     traMADol 50 MG tablet  Commonly known as:  ULTRAM  Take 1-2 tablets (50-100 mg total) by mouth every 6 (six) hours as needed.           Follow-up Information   Follow up with Loanne Drilling, MD. Schedule an appointment as soon as possible for a visit on 07/27/2012. (Call [phone] tomorrow to make the appointment)    Contact information:   [address], [address] [place] [address] [phone]       Signed: Patrica Duel 07/29/2012, 9:36 AM

## 2013-02-01 ENCOUNTER — Other Ambulatory Visit (HOSPITAL_COMMUNITY): Payer: Self-pay | Admitting: Obstetrics and Gynecology

## 2013-02-01 DIAGNOSIS — Z1231 Encounter for screening mammogram for malignant neoplasm of breast: Secondary | ICD-10-CM

## 2013-02-28 ENCOUNTER — Ambulatory Visit (HOSPITAL_COMMUNITY)
Admission: RE | Admit: 2013-02-28 | Discharge: 2013-02-28 | Disposition: A | Discharge status: Home / Self Care | Payer: BC Managed Care – PPO | Source: Ambulatory Visit | Attending: Obstetrics and Gynecology | Admitting: Obstetrics and Gynecology

## 2013-02-28 DIAGNOSIS — Z1231 Encounter for screening mammogram for malignant neoplasm of breast: Secondary | ICD-10-CM | POA: Insufficient documentation

## 2013-11-09 DIAGNOSIS — Z903 Acquired absence of stomach [part of]: Secondary | ICD-10-CM

## 2013-11-09 DIAGNOSIS — K912 Postsurgical malabsorption, not elsewhere classified: Secondary | ICD-10-CM | POA: Insufficient documentation

## 2013-11-09 DIAGNOSIS — E559 Vitamin D deficiency, unspecified: Secondary | ICD-10-CM | POA: Insufficient documentation

## 2013-11-09 DIAGNOSIS — M810 Age-related osteoporosis without current pathological fracture: Secondary | ICD-10-CM | POA: Insufficient documentation

## 2013-11-09 DIAGNOSIS — Z9884 Bariatric surgery status: Secondary | ICD-10-CM

## 2013-11-09 HISTORY — DX: Age-related osteoporosis without current pathological fracture: M81.0

## 2013-11-09 HISTORY — DX: Vitamin D deficiency, unspecified: E55.9

## 2013-11-09 HISTORY — DX: Bariatric surgery status: Z98.84

## 2013-11-09 HISTORY — DX: Postsurgical malabsorption, not elsewhere classified: K91.2

## 2014-02-02 ENCOUNTER — Other Ambulatory Visit (HOSPITAL_COMMUNITY): Payer: Self-pay | Admitting: Obstetrics and Gynecology

## 2014-02-02 DIAGNOSIS — Z1231 Encounter for screening mammogram for malignant neoplasm of breast: Secondary | ICD-10-CM

## 2014-03-07 ENCOUNTER — Ambulatory Visit (HOSPITAL_COMMUNITY)
Admission: RE | Admit: 2014-03-07 | Discharge: 2014-03-07 | Disposition: A | Discharge status: Home / Self Care | Payer: BC Managed Care – PPO | Source: Ambulatory Visit | Attending: Obstetrics and Gynecology | Admitting: Obstetrics and Gynecology

## 2014-03-07 DIAGNOSIS — Z1231 Encounter for screening mammogram for malignant neoplasm of breast: Secondary | ICD-10-CM | POA: Diagnosis not present

## 2015-12-25 DIAGNOSIS — N2581 Secondary hyperparathyroidism of renal origin: Secondary | ICD-10-CM | POA: Insufficient documentation

## 2015-12-25 HISTORY — DX: Secondary hyperparathyroidism of renal origin: N25.81

## 2017-11-12 DIAGNOSIS — M2550 Pain in unspecified joint: Secondary | ICD-10-CM

## 2017-11-12 HISTORY — DX: Pain in unspecified joint: M25.50

## 2017-12-26 DIAGNOSIS — R0602 Shortness of breath: Secondary | ICD-10-CM

## 2017-12-26 DIAGNOSIS — D649 Anemia, unspecified: Secondary | ICD-10-CM | POA: Diagnosis not present

## 2017-12-26 DIAGNOSIS — M810 Age-related osteoporosis without current pathological fracture: Secondary | ICD-10-CM

## 2017-12-26 DIAGNOSIS — R079 Chest pain, unspecified: Secondary | ICD-10-CM

## 2017-12-26 DIAGNOSIS — Z9884 Bariatric surgery status: Secondary | ICD-10-CM

## 2017-12-27 DIAGNOSIS — D649 Anemia, unspecified: Secondary | ICD-10-CM | POA: Diagnosis not present

## 2017-12-27 DIAGNOSIS — R0602 Shortness of breath: Secondary | ICD-10-CM | POA: Diagnosis not present

## 2017-12-27 DIAGNOSIS — M810 Age-related osteoporosis without current pathological fracture: Secondary | ICD-10-CM | POA: Diagnosis not present

## 2017-12-27 DIAGNOSIS — E119 Type 2 diabetes mellitus without complications: Secondary | ICD-10-CM | POA: Diagnosis not present

## 2017-12-27 DIAGNOSIS — R079 Chest pain, unspecified: Secondary | ICD-10-CM | POA: Diagnosis not present

## 2017-12-28 DIAGNOSIS — R079 Chest pain, unspecified: Secondary | ICD-10-CM | POA: Diagnosis not present

## 2017-12-31 ENCOUNTER — Other Ambulatory Visit: Payer: Self-pay | Admitting: Family Medicine

## 2017-12-31 DIAGNOSIS — N63 Unspecified lump in unspecified breast: Secondary | ICD-10-CM

## 2018-01-05 ENCOUNTER — Ambulatory Visit: Payer: Self-pay

## 2018-01-05 ENCOUNTER — Ambulatory Visit
Admission: RE | Admit: 2018-01-05 | Discharge: 2018-01-05 | Disposition: A | Discharge status: Home / Self Care | Payer: BLUE CROSS/BLUE SHIELD | Source: Ambulatory Visit | Attending: Family Medicine | Admitting: Family Medicine

## 2018-01-05 ENCOUNTER — Encounter: Payer: Self-pay | Admitting: Emergency Medicine

## 2018-01-05 ENCOUNTER — Other Ambulatory Visit: Payer: Self-pay | Admitting: Emergency Medicine

## 2018-01-05 DIAGNOSIS — D649 Anemia, unspecified: Secondary | ICD-10-CM

## 2018-01-05 DIAGNOSIS — N63 Unspecified lump in unspecified breast: Secondary | ICD-10-CM

## 2018-01-05 DIAGNOSIS — E119 Type 2 diabetes mellitus without complications: Secondary | ICD-10-CM

## 2018-01-05 HISTORY — DX: Anemia, unspecified: D64.9

## 2018-01-05 HISTORY — DX: Type 2 diabetes mellitus without complications: E11.9

## 2018-01-27 DIAGNOSIS — R079 Chest pain, unspecified: Secondary | ICD-10-CM | POA: Insufficient documentation

## 2018-01-27 HISTORY — DX: Chest pain, unspecified: R07.9

## 2018-01-27 NOTE — Progress Notes (Signed)
Cardiology Office Note:    Date:  01/28/2018   ID:  Bianca Williams, DOB 1955/11/23, MRN [id number]  PCP:  Maris Berger, MD  Cardiologist:  Norman Herrlich, MD   Referring MD: Maris Berger, MD  ASSESSMENT:    1. Chest pain in adult   2. Arthralgia, unspecified joint    PLAN:    In order of problems listed above:  1. She presented recently with a long episode of anginal discomfort no evidence of myocardial infarction and a normal myocardial perfusion study.  Neither of [place] are convinced that she does have underlying heart disease for further evaluation undergo TIA.  If normal then I think that she likely had costochondral pain syndrome related to her underlying arthritic disorder is intolerant of nonsteroidal anti-inflammatory drugs and I would refer to physical therapy modalities. 2. Stable managed by rheumatology  Next appointment   Medication Adjustments/Labs and Tests Ordered: Current medicines are reviewed at length with the patient today.  Concerns regarding medicines are outlined above.  Orders Placed This Encounter  Procedures  . CT CORONARY MORPH W/CTA COR W/SCORE W/CA W/CM &/OR WO/CM  . CT CORONARY FRACTIONAL FLOW RESERVE DATA PREP  . CT CORONARY FRACTIONAL FLOW RESERVE FLUID ANALYSIS   No orders of the defined types were placed in this encounter.    Chief Complaint  Patient presents with  . Chest Pain    History of Present Illness:    Bianca Williams is a 62 y.o. female who is being seen today for the evaluation of chest pain at the request of Sistasis, Rowena, MD. She was seem by dr Tomie China > 8yrs ago. She had a recent [facility] admission with chest pain 12/28/2017.  Laboratory studies show anemia hemoglobin 10.6 normal renal function EKG was described as normal.  Her troponins were normal there is no evidence of acute coronary syndrome she had a myocardial perfusion study performed same admission to the hospital it was [facility] pharmacologic  EF 82% and normal myocardial perfusion.  My independent review of her EKG shows sinus rhythm is normal although I do not have the report her family physician narrates that a chest CT was also normal.   Her presentation was 1 of prolonged atypical angina she describes severe substernal chest pain that radiated to her neck it occurred at rest was unrelieved with rest lasted for more than 30 to 45 minutes and afterwards she was diffusely sore in her chest and little worse with a deep breath.  She has had previous symptoms and has had a previous hospital evaluation years ago she had a normal pharmacologic perfusion study is very concerned that she has underlying heart disease.  She sees a rheumatologist she has arthralgia and some type of arthritis involving small joints of her hands and feet and is taken Plaquenil.  She has not had exertional chest pain shortness of breath palpitation or syncope.  She has had a cholecystectomy.  She has no heartburn or indigestion and has had no esophageal disease.  She had no fever chills no upper respiratory infection and no preceding trauma.  She has no history of previous heart disease but underwent evaluation for chest pain in the past that was also normal.  I am concerned with the limitations of a pharmacologic myocardial perfusion study and the patient is concerned she is underlying heart disease.  We discussed the utility of a cardiac CT scan to define whether or not she has CAD and to guide [place] whether she is  having other sources of chest pain such as costochondritis associated with her arthritis.  After discussion the patient wishes to pursue evaluation will be scheduled for cardiac CTA as an outpatient.  She has no dye allergy and abnormal renal function during recent hospitalization.  She has no known history of congenital or rheumatic heart disease. Past Medical History:  Diagnosis Date  . Anemia 01/05/2018  . Anxiety   . Arthralgia 11/12/2017  . Arthritis   .  Balance problem    PT ATTRIBUTES TO HER KNEE ARTHRITIS-BONE ON BONE  . Depression    RESOLVED  . Diabetes mellitus (HCC)   . Diabetes mellitus (HCC) 01/05/2018   Resolved after after gastric bypass   . Fibromyalgia   . Fractured    WRIST 12/26/2011 --CAST HAS BEEN REMOVED--STILL SORE AND HEALING  . H/O gastric bypass 11/09/2013  . Hemorrhoids   . Intestinal malabsorption following gastrectomy 11/09/2013  . Memory problem    THOUGHT TO BE RELATED TO LOW VITAMIN B--NOW ON SUPPLEMENT AND HAS NOTICED IMPROVEMENT  . OA (osteoarthritis) of knee 03/08/2012  . Osteoporosis   . Osteoporosis, senile 11/09/2013  . Postop Acute blood loss anemia 03/09/2012  . Postop Hypokalemia 03/10/2012  . Secondary hyperparathyroidism (HCC) 12/25/2015  . Vertigo    HX OF MILD EPISODES  . Vitamin D deficiency 11/09/2013    Past Surgical History:  Procedure Laterality Date  . CATARACT EXTRACTION, BILATERAL    . CESAREAN SECTION  09/16/84  . CHOLECYSTECTOMY  2003 OR 2004  . GASTRIC BYPASS SURGERY  11/08/89   ROUX-EN -Y  . HAND SURGERY    . HERNIA REPAIR  ? 1995   UMBILICAL HERNIA REPAIR  . LEFT ROTATOR CUFF  03/19/10  . TOTAL KNEE ARTHROPLASTY  03/08/2012   Procedure: TOTAL KNEE ARTHROPLASTY;  Surgeon: Loanne Drilling, MD;  Location: [facility];  Service: Orthopedics;  Laterality: Right;  . TOTAL KNEE ARTHROPLASTY Left 07/12/2012   Procedure: TOTAL KNEE ARTHROPLASTY;  Surgeon: Loanne Drilling, MD;  Location: [facility];  Service: Orthopedics;  Laterality: Left;    Current Medications: Current Meds  Medication Sig  . alendronate (FOSAMAX) 70 MG tablet TAKE 1 TABLET BY MOUTH ONE TIME PER WEEK  . Apoaequorin (PREVAGEN PO) Take 1 tablet by mouth daily.  Marland Kitchen BIOTIN PO Take 1 tablet by mouth daily.  . Calcium Carbonate-Vit D-Min (CALTRATE 600+D PLUS MINERALS) 600-800 MG-UNIT CHEW Chew 1 Units by mouth daily.  Marland Kitchen CALCIUM PO Take 1 tablet by mouth daily.  . Coenzyme Q10 (CO Q 10 PO) Take 400 mg by mouth daily.  .  Ergocalciferol (VITAMIN D2 PO) Take 1 tablet by mouth once a week.  . hydroxychloroquine (PLAQUENIL) 200 MG tablet Take 300 mg by mouth daily.  . [DISCONTINUED] Multiple Vitamins-Minerals (OCUVITE ADULT 50+) CAPS Take 1 tablet by mouth daily.     Allergies:   Cephalexin; Hydrocodone-acetaminophen; Naproxen; Morphine and related; Sulfa antibiotics; and Vicodin [hydrocodone-acetaminophen]   Social History   Socioeconomic History  . Marital status: Married    Spouse name: Not on file  . Number of children: Not on file  . Years of education: Not on file  . Highest education level: Not on file  Occupational History  . Not on file  Social Needs  . Financial resource strain: Not on file  . Food insecurity:    Worry: Not on file    Inability: Not on file  . Transportation needs:    Medical: Not on file    Non-medical:  Not on file  Tobacco Use  . Smoking status: Never Smoker  . Smokeless tobacco: Never Used  Substance and Sexual Activity  . Alcohol use: Not Currently    Comment: SOMETIMES COUPLE OF GLASSES OF WINE A DAY  . Drug use: No  . Sexual activity: Not on file  Lifestyle  . Physical activity:    Days per week: Not on file    Minutes per session: Not on file  . Stress: Not on file  Relationships  . Social connections:    Talks on phone: Not on file    Gets together: Not on file    Attends religious service: Not on file    Active member of club or organization: Not on file    Attends meetings of clubs or organizations: Not on file    Relationship status: Not on file  Other Topics Concern  . Not on file  Social History Narrative  . Not on file     Family History: The patient's family history includes Breast cancer in her mother; Coronary artery disease in her brother; Hypertension in her mother; Stroke in her mother.  ROS:   ROS Please see the history of present illness.     All other systems reviewed and are negative.  EKGs/Labs/Other Studies Reviewed:    The  following studies were reviewed today: Visit I reviewed with [facility] records including labs emergency room no EKGs and myocardial perfusion study  EKG:  EKG [facility] independently reviewed normal  Recent Labs: No results found for requested labs within last 8760 hours.  Recent Lipid Panel No results found for: CHOL, TRIG, HDL, CHOLHDL, VLDL, LDLCALC, LDLDIRECT  Physical Exam:    VS:  BP 120/74 (BP Location: Right Arm, Patient Position: Sitting, Cuff Size: Normal)   Pulse 68   Ht 5\' 1"  (1.549 m)   Wt 117 lb 9.6 oz (53.3 kg)   SpO2 98%   BMI 22.22 kg/m     Wt Readings from Last 3 Encounters:  01/28/18 117 lb 9.6 oz (53.3 kg)  07/12/12 118 lb (53.5 kg)  07/02/12 118 lb (53.5 kg)     GEN:  Well nourished, well developed in no acute distress HEENT: Normal NECK: No JVD; No carotid bruits LYMPHATICS: No lymphadenopathy CARDIAC: There is mild costochondral junction tenderness but does not really reproduce her symptom complex RRR, no murmurs, rubs, gallops RESPIRATORY:  Clear to auscultation without rales, wheezing or rhonchi  ABDOMEN: Soft, non-tender, non-distended MUSCULOSKELETAL:  No edema; No deformity  SKIN: Warm and dry NEUROLOGIC:  Alert and oriented x 3 PSYCHIATRIC:  Normal affect     Signed, Norman Herrlich, MD  01/28/2018 3:35 PM    [facility]

## 2018-01-28 ENCOUNTER — Encounter: Payer: Self-pay | Admitting: Cardiology

## 2018-01-28 ENCOUNTER — Ambulatory Visit (INDEPENDENT_AMBULATORY_CARE_PROVIDER_SITE_OTHER): Payer: BLUE CROSS/BLUE SHIELD | Admitting: Cardiology

## 2018-01-28 VITALS — BP 120/74 | HR 68 | Ht 61.0 in | Wt 117.6 lb

## 2018-01-28 DIAGNOSIS — R079 Chest pain, unspecified: Secondary | ICD-10-CM | POA: Diagnosis not present

## 2018-01-28 DIAGNOSIS — M2550 Pain in unspecified joint: Secondary | ICD-10-CM

## 2018-01-28 MED ORDER — METOPROLOL TARTRATE 50 MG PO TABS: 50.0000 mg | ORAL_TABLET | Freq: Once | ORAL | 0 refills | Status: DC

## 2018-01-28 NOTE — Patient Instructions (Addendum)
Medication Instructions:  Your physician recommends that you continue on your current medications as directed. Please refer to the Current Medication list given to you today.   Labwork: You will have labs drawn 3 to 7 days prior to Cardiac CTA:  BMP  Testing/Procedures:  Your physician has requested that you have cardiac CT. Cardiac computed tomography (CT) is a painless test that uses an x-ray machine to take clear, detailed pictures of your heart. For further information please visit [email or web address] Please follow instruction sheet as given.    Please arrive at the [facility] main entrance of [facility] at xx:xx AM (30-45 minutes prior to test start time)  [facility] [address] [place], [place] [address] [phone]  Proceed to the [facility] Radiology Department (First Floor).  Please follow these instructions carefully (unless otherwise directed):   On the Night Before the Test: . Drink plenty of water. . Do not consume any caffeinated/decaffeinated beverages or chocolate 12 hours prior to your test. . Do not take any antihistamines 12 hours prior to your test.  On the Day of the Test: . Drink plenty of water. Do not drink any water within one hour of the test. . Do not eat any food 4 hours prior to the test. . You may take your regular medications prior to the test. . IF NOT ON A BETA BLOCKER - Take 50 mg of lopressor (metoprolol) one hour before the test.  After the Test: . Drink plenty of water. . After receiving IV contrast, you may experience a mild flushed feeling. This is normal. . On occasion, you may experience a mild rash up to 24 hours after the test. This is not dangerous. If this occurs, you can take Benadryl 25 mg and increase your fluid intake. . If you experience trouble breathing, this can be serious. If it is severe call 911 IMMEDIATELY. If it is mild, please call our office.    Follow-Up: Your physician wants  you to follow-up in: 3 months. You will receive a reminder letter in the mail two months in advance. If you don't receive a letter, please call our office to schedule the follow-up appointment.   Any Other Special Instructions Will Be Listed Below (If Applicable).     If you need a refill on your cardiac medications before your next appointment, please call your pharmacy.      Costochondritis Costochondritis is swelling and irritation (inflammation) of the tissue (cartilage) that connects your ribs to your breastbone (sternum). This causes pain in the front of your chest. Usually, the pain:  Starts gradually.  Is in more than one rib.  This condition usually goes away on its own over time. Follow these instructions at home:  Do not do anything that makes your pain worse.  If directed, put ice on the painful area: ? Put ice in a plastic bag. ? Place a towel between your skin and the bag. ? Leave the ice on for 20 minutes, 2-3 times a day.  If directed, put heat on the affected area as often as told by your doctor. Use the heat source that your doctor tells you to use, such as a moist heat pack or a heating pad. ? Place a towel between your skin and the heat source. ? Leave the heat on for 20-30 minutes. ? Take off the heat if your skin turns bright red. This is very important if you cannot feel pain, heat, or cold. You  may have a greater risk of getting burned.  Take over-the-counter and prescription medicines only as told by your doctor.  Return to your normal activities as told by your doctor. Ask your doctor what activities are safe for you.  Keep all follow-up visits as told by your doctor. This is important. Contact a doctor if:  You have chills or a fever.  Your pain does not go away or it gets worse.  You have a cough that does not go away. Get help right away if:  You are short of breath. This information is not intended to replace advice given to you by your  health care provider. Make sure you discuss any questions you have with your health care provider. Document Released: 10/29/2007 Document Revised: 11/30/2015 Document Reviewed: 09/05/2015 Elsevier Interactive Patient Education  [employer].

## 2018-02-10 ENCOUNTER — Other Ambulatory Visit: Payer: Self-pay | Admitting: Obstetrics and Gynecology

## 2018-03-17 LAB — BASIC METABOLIC PANEL
BUN/Creatinine Ratio: 30 — ABNORMAL HIGH (ref 12–28)
BUN: 18 mg/dL (ref 8–27)
CALCIUM: 8.6 mg/dL — AB (ref 8.7–10.3)
CHLORIDE: 105 mmol/L (ref 96–106)
CO2: 28 mmol/L (ref 20–29)
CREATININE: 0.6 mg/dL (ref 0.57–1.00)
GFR calc Af Amer: 111 mL/min/{1.73_m2} (ref >59)
GFR calc non Af Amer: 97 mL/min/{1.73_m2} (ref >59)
Glucose: 157 mg/dL — ABNORMAL HIGH (ref 65–99)
Potassium: 4.3 mmol/L (ref 3.5–5.2)
Sodium: 144 mmol/L (ref 134–144)

## 2018-03-25 ENCOUNTER — Ambulatory Visit (HOSPITAL_COMMUNITY): Payer: BLUE CROSS/BLUE SHIELD

## 2018-03-25 ENCOUNTER — Ambulatory Visit (HOSPITAL_COMMUNITY)
Admission: RE | Admit: 2018-03-25 | Discharge: 2018-03-25 | Disposition: A | Discharge status: Home / Self Care | Payer: BLUE CROSS/BLUE SHIELD | Source: Ambulatory Visit | Attending: Cardiology | Admitting: Cardiology

## 2018-03-25 DIAGNOSIS — R079 Chest pain, unspecified: Secondary | ICD-10-CM | POA: Diagnosis present

## 2018-03-25 MED ORDER — NITROGLYCERIN 0.4 MG SL SUBL
SUBLINGUAL_TABLET | SUBLINGUAL | Status: AC
  Filled 2018-03-25: qty 2

## 2018-03-25 MED ORDER — NITROGLYCERIN 0.4 MG SL SUBL
0.8000 mg | SUBLINGUAL_TABLET | Freq: Once | SUBLINGUAL | Status: AC
  Administered 2018-03-25: 0.8 mg via SUBLINGUAL
  Filled 2018-03-25: qty 25

## 2018-03-25 MED ORDER — IOPAMIDOL (ISOVUE-370) INJECTION 76%
100.0000 mL | Freq: Once | INTRAVENOUS | Status: AC | PRN
  Administered 2018-03-25: 80 mL via INTRAVENOUS

## 2020-06-05 DIAGNOSIS — R42 Dizziness and giddiness: Secondary | ICD-10-CM | POA: Insufficient documentation

## 2020-06-05 DIAGNOSIS — T148XXA Other injury of unspecified body region, initial encounter: Secondary | ICD-10-CM | POA: Insufficient documentation

## 2020-06-05 DIAGNOSIS — M1990 Unspecified osteoarthritis, unspecified site: Secondary | ICD-10-CM | POA: Insufficient documentation

## 2020-06-05 DIAGNOSIS — K649 Unspecified hemorrhoids: Secondary | ICD-10-CM | POA: Insufficient documentation

## 2020-06-05 DIAGNOSIS — M810 Age-related osteoporosis without current pathological fracture: Secondary | ICD-10-CM | POA: Insufficient documentation

## 2020-06-05 DIAGNOSIS — F32A Depression, unspecified: Secondary | ICD-10-CM | POA: Insufficient documentation

## 2020-06-05 DIAGNOSIS — R413 Other amnesia: Secondary | ICD-10-CM | POA: Insufficient documentation

## 2020-06-05 DIAGNOSIS — R2689 Other abnormalities of gait and mobility: Secondary | ICD-10-CM | POA: Insufficient documentation

## 2020-06-05 DIAGNOSIS — F419 Anxiety disorder, unspecified: Secondary | ICD-10-CM | POA: Insufficient documentation

## 2020-06-05 DIAGNOSIS — M797 Fibromyalgia: Secondary | ICD-10-CM | POA: Insufficient documentation

## 2020-06-12 ENCOUNTER — Ambulatory Visit: Payer: BLUE CROSS/BLUE SHIELD | Admitting: Cardiology

## 2020-07-10 NOTE — Progress Notes (Unsigned)
Cardiology Office Note:    Date:  07/11/2020   ID:  Bianca Williams, DOB 22-Jun-1955, MRN [id number]  PCP:  Maris Berger, MD  Cardiologist:  Norman Herrlich, MD    Referring MD: Maris Berger, MD    ASSESSMENT:    1. Chest pain in adult    PLAN:    In order of problems listed above:  1. Her cardiovascular risk is very low with a coronary calcium score of 0 normal coronary arteries 2 years ago I told her we could consider a follow-up CT calcium score at 7 years in 2026. 2. Check lipid profile today   Next appointment: As needed   Medication Adjustments/Labs and Tests Ordered: Current medicines are reviewed at length with the patient today.  Concerns regarding medicines are outlined above.  Orders Placed This Encounter  Procedures  . Lipid panel  . EKG 12-Lead   No orders of the defined types were placed in this encounter.   No chief complaint on file.   History of Present Illness:    Bianca Williams is a 65 y.o. female with a hx of arthritis last seen by me September 2019 with a coronary artery calcium score of 0 and normal coronary arteries on cardiac CTA.Marland Kitchen Compliance with diet, lifestyle and medications: Yes  She has had no further chest pain edema shortness of breath or palpitation.  Labs 06/20/2020 potassium 3.9 GFR greater than 90 cc Past Medical History:  Diagnosis Date  . Anemia 01/05/2018  . Anxiety   . Arthralgia 11/12/2017  . Arthritis   . Balance problem    PT ATTRIBUTES TO HER KNEE ARTHRITIS-BONE ON BONE  . Depression    RESOLVED  . Diabetes mellitus (HCC)   . Diabetes mellitus (HCC) 01/05/2018   Resolved after after gastric bypass   . Fibromyalgia   . Fractured    WRIST 12/26/2011 --CAST HAS BEEN REMOVED--STILL SORE AND HEALING  . H/O gastric bypass 11/09/2013  . Hemorrhoids   . Intestinal malabsorption following gastrectomy 11/09/2013  . Memory problem    THOUGHT TO BE RELATED TO LOW VITAMIN B--NOW ON SUPPLEMENT AND HAS NOTICED  IMPROVEMENT  . OA (osteoarthritis) of knee 03/08/2012  . Osteoporosis   . Osteoporosis, senile 11/09/2013  . Postop Acute blood loss anemia 03/09/2012  . Postop Hypokalemia 03/10/2012  . Secondary hyperparathyroidism (HCC) 12/25/2015  . Vertigo    HX OF MILD EPISODES  . Vitamin D deficiency 11/09/2013    Past Surgical History:  Procedure Laterality Date  . CATARACT EXTRACTION, BILATERAL    . CESAREAN SECTION  09/16/84  . CHOLECYSTECTOMY  2003 OR 2004  . GASTRIC BYPASS SURGERY  11/08/89   ROUX-EN -Y  . HAND SURGERY    . HERNIA REPAIR  ? 1995   UMBILICAL HERNIA REPAIR  . LEFT ROTATOR CUFF  03/19/10  . TOTAL KNEE ARTHROPLASTY  03/08/2012   Procedure: TOTAL KNEE ARTHROPLASTY;  Surgeon: Loanne Drilling, MD;  Location: [facility];  Service: Orthopedics;  Laterality: Right;  . TOTAL KNEE ARTHROPLASTY Left 07/12/2012   Procedure: TOTAL KNEE ARTHROPLASTY;  Surgeon: Loanne Drilling, MD;  Location: [facility];  Service: Orthopedics;  Laterality: Left;    Current Medications: Current Meds  Medication Sig  . Apoaequorin (PREVAGEN PO) Take 1 tablet by mouth daily.  . Calcium Carbonate-Vit D-Min (CALTRATE 600+D PLUS MINERALS) 600-800 MG-UNIT CHEW Chew 1 Units by mouth daily.  . Coenzyme Q10 (CO Q 10 PO) Take 400 mg by mouth daily.  . ergocalciferol (VITAMIN  D2) 1.25 MG (50000 UT) capsule Take 1 capsule by mouth once a week.  . hydroxychloroquine (PLAQUENIL) 200 MG tablet Take 300 mg by mouth daily.  . Multiple Vitamins-Minerals (OCUVITE ADULT 50+) CAPS Take 1 capsule by mouth daily.     Allergies:   Cephalexin, Hydrocodone-acetaminophen, Naproxen, Morphine and related, Sulfa antibiotics, and Vicodin [hydrocodone-acetaminophen]   Social History   Socioeconomic History  . Marital status: Married    Spouse name: Not on file  . Number of children: Not on file  . Years of education: Not on file  . Highest education level: Not on file  Occupational History  . Not on file  Tobacco Use  . Smoking  status: Never Smoker  . Smokeless tobacco: Never Used  Vaping Use  . Vaping Use: Never used  Substance and Sexual Activity  . Alcohol use: Not Currently    Comment: SOMETIMES COUPLE OF GLASSES OF WINE A DAY  . Drug use: No  . Sexual activity: Not on file  Other Topics Concern  . Not on file  Social History Narrative  . Not on file   Social Determinants of Health   Financial Resource Strain: Not on file  Food Insecurity: Not on file  Transportation Needs: Not on file  Physical Activity: Not on file  Stress: Not on file  Social Connections: Not on file     Family History: The patient's family history includes Breast cancer in her mother; Coronary artery disease in her brother; Hypertension in her mother; Stroke in her mother. ROS:   Please see the history of present illness.    All other systems reviewed and are negative.  EKGs/Labs/Other Studies Reviewed:    The following studies were reviewed today:  EKG:  EKG ordered today and personally reviewed.  The ekg ordered today demonstrates sinus rhythm normal EKG including QT interval  Recent Labs: 06/20/2020 normal BMP GFR 98 cc potassium 3.9  Physical Exam:    VS:  BP 130/74   Pulse 66   Ht 5\' 1"  (1.549 m)   Wt 120 lb 6.4 oz (54.6 kg)   SpO2 96%   BMI 22.75 kg/m     Wt Readings from Last 3 Encounters:  07/11/20 120 lb 6.4 oz (54.6 kg)  01/28/18 117 lb 9.6 oz (53.3 kg)  07/12/12 118 lb (53.5 kg)     GEN:  Well nourished, well developed in no acute distress HEENT: Normal NECK: No JVD; No carotid bruits LYMPHATICS: No lymphadenopathy CARDIAC: RRR, no murmurs, rubs, gallops RESPIRATORY:  Clear to auscultation without rales, wheezing or rhonchi  ABDOMEN: Soft, non-tender, non-distended MUSCULOSKELETAL:  No edema; No deformity  SKIN: Warm and dry NEUROLOGIC:  Alert and oriented x 3 PSYCHIATRIC:  Normal affect    Signed, 07/14/12, MD  07/11/2020 2:45 PM    [facility]

## 2020-07-11 ENCOUNTER — Other Ambulatory Visit: Payer: Self-pay

## 2020-07-11 ENCOUNTER — Encounter: Payer: Self-pay | Admitting: Cardiology

## 2020-07-11 ENCOUNTER — Ambulatory Visit (INDEPENDENT_AMBULATORY_CARE_PROVIDER_SITE_OTHER): Payer: Medicare Other | Admitting: Cardiology

## 2020-07-11 VITALS — BP 130/74 | HR 66 | Ht 61.0 in | Wt 120.4 lb

## 2020-07-11 DIAGNOSIS — R079 Chest pain, unspecified: Secondary | ICD-10-CM

## 2020-07-11 NOTE — Patient Instructions (Signed)
Medication Instructions:  Your physician recommends that you continue on your current medications as directed. Please refer to the Current Medication list given to you today.  *If you need a refill on your cardiac medications before your next appointment, please call your pharmacy*   Lab Work: Your physician recommends that you return for lab work in: TODAY Lipids If you have labs (blood work) drawn today and your tests are completely normal, you will receive your results only by: Marland Kitchen MyChart Message (if you have MyChart) OR . A paper copy in the mail If you have any lab test that is abnormal or we need to change your treatment, we will call you to review the results.   Testing/Procedures: None   Follow-Up: At [facility], you and your health needs are our priority.  As part of our continuing mission to provide you with exceptional heart care, we have created designated Provider Care Teams.  These Care Teams include your primary Cardiologist (physician) and Advanced Practice Providers (APPs -  Physician Assistants and Nurse Practitioners) who all work together to provide you with the care you need, when you need it.  We recommend signing up for the patient portal called "MyChart".  Sign up information is provided on this After Visit Summary.  MyChart is used to connect with patients for Virtual Visits (Telemedicine).  Patients are able to view lab/test results, encounter notes, upcoming appointments, etc.  Non-urgent messages can be sent to your provider as well.   To learn more about what you can do with MyChart, go to [email or web address].    Your next appointment:   As needed  The format for your next appointment:   In Person  Provider:   Norman Herrlich, MD   Other Instructions

## 2020-07-12 ENCOUNTER — Telehealth: Payer: Self-pay

## 2020-07-12 LAB — LIPID PANEL
Chol/HDL Ratio: 2.5 ratio (ref 0.0–4.4)
Cholesterol, Total: 165 mg/dL (ref 100–199)
HDL: 67 mg/dL (ref >39)
LDL Chol Calc (NIH): 71 mg/dL (ref 0–99)
Triglycerides: 161 mg/dL — ABNORMAL HIGH (ref 0–149)
VLDL Cholesterol Cal: 27 mg/dL (ref 5–40)

## 2020-07-12 NOTE — Telephone Encounter (Signed)
-----   Message from Baldo Daub, MD sent at 07/12/2020  7:46 AM EST ----- Good lipid profile

## 2020-07-12 NOTE — Telephone Encounter (Signed)
Spoke with patient regarding results and recommendation.  Patient verbalizes understanding and is agreeable to plan of care. Advised patient to call back with any issues or concerns.  

## 2022-01-09 ENCOUNTER — Encounter: Payer: Self-pay | Admitting: Podiatry

## 2022-01-09 ENCOUNTER — Ambulatory Visit: Payer: Medicare Other | Admitting: Podiatry

## 2022-01-09 DIAGNOSIS — L84 Corns and callosities: Secondary | ICD-10-CM | POA: Diagnosis not present

## 2022-01-09 DIAGNOSIS — M2042 Other hammer toe(s) (acquired), left foot: Secondary | ICD-10-CM

## 2022-01-09 DIAGNOSIS — M2041 Other hammer toe(s) (acquired), right foot: Secondary | ICD-10-CM

## 2022-01-09 DIAGNOSIS — E119 Type 2 diabetes mellitus without complications: Secondary | ICD-10-CM | POA: Diagnosis not present

## 2022-01-18 NOTE — Progress Notes (Signed)
Subjective: Bianca Williams [facility] presents today for diabetic foot evaluation.  Patient denies any h/o foot wounds.  Patient admits symptoms of foot numbness.   Patient admits symptoms of foot tingling.  Patient admits symptoms of burning in feet.  Patient admits symptoms of pins/needles sensation in feet.  Patient denies any numbness, tingling, burning, or pins/needle sensation in feet.  Risk factors: diabetes.  PCP is Sistasis, Lowell Guitar, MD , and last visit was November 27, 2021.  Past Medical History:  Diagnosis Date   Anemia 01/05/2018   Anxiety    Arthralgia 11/12/2017   Arthritis    Balance problem    PT ATTRIBUTES TO HER KNEE ARTHRITIS-BONE ON BONE   Depression    RESOLVED   Diabetes mellitus (HCC)    Diabetes mellitus (HCC) 01/05/2018   Resolved after after gastric bypass    Fibromyalgia    Fractured    WRIST 12/26/2011 --CAST HAS BEEN REMOVED--STILL SORE AND HEALING   H/O gastric bypass 11/09/2013   Hemorrhoids    Intestinal malabsorption following gastrectomy 11/09/2013   Memory problem    THOUGHT TO BE RELATED TO LOW VITAMIN B--NOW ON SUPPLEMENT AND HAS NOTICED IMPROVEMENT   OA (osteoarthritis) of knee 03/08/2012   Osteoporosis    Osteoporosis, senile 11/09/2013   Postop Acute blood loss anemia 03/09/2012   Postop Hypokalemia 03/10/2012   Secondary hyperparathyroidism (HCC) 12/25/2015   Vertigo    HX OF MILD EPISODES   Vitamin D deficiency 11/09/2013    Patient Active Problem List   Diagnosis Date Noted   Vertigo    Osteoporosis    Memory problem    Hemorrhoids    Fractured    Fibromyalgia    Depression    Balance problem    Arthritis    Anxiety    Chest pain in adult 01/27/2018   Anemia 01/05/2018   Diabetes mellitus (HCC) 01/05/2018   Arthralgia 11/12/2017   Secondary hyperparathyroidism (HCC) 12/25/2015   H/O gastric bypass 11/09/2013   Intestinal malabsorption following gastrectomy 11/09/2013   Osteoporosis, senile 11/09/2013   Vitamin D deficiency  11/09/2013   Postop Hypokalemia 03/10/2012   Postop Acute blood loss anemia 03/09/2012   OA (osteoarthritis) of knee 03/08/2012    Past Surgical History:  Procedure Laterality Date   CATARACT EXTRACTION, BILATERAL     CESAREAN SECTION  09/16/84   CHOLECYSTECTOMY  2003 OR 2004   GASTRIC BYPASS SURGERY  11/08/89   ROUX-EN -Y   HAND SURGERY     HERNIA REPAIR  ? 1995   UMBILICAL HERNIA REPAIR   LEFT ROTATOR CUFF  03/19/10   TOTAL KNEE ARTHROPLASTY  03/08/2012   Procedure: TOTAL KNEE ARTHROPLASTY;  Surgeon: Loanne Drilling, MD;  Location: [facility];  Service: Orthopedics;  Laterality: Right;   TOTAL KNEE ARTHROPLASTY Left 07/12/2012   Procedure: TOTAL KNEE ARTHROPLASTY;  Surgeon: Loanne Drilling, MD;  Location: [facility];  Service: Orthopedics;  Laterality: Left;    Current Outpatient Medications on File Prior to Visit  Medication Sig Dispense Refill   Cholecalciferol 1.25 MG (50000 UT) capsule Take 1 capsule by mouth once a week.     denosumab (PROLIA) 60 MG/ML SOSY injection      estradiol (ESTRACE) 0.1 MG/GM vaginal cream APPLY TO THE LABIA OR VAGINALLY AT BEDRIME 3X A WEEK FOR MENOPAUSAL CHANGES.     hydroxychloroquine (PLAQUENIL) 200 MG tablet Take 1.5 tablets by mouth daily.     alendronate (FOSAMAX) 70 MG tablet TAKE 1 TABLET BY MOUTH ONE TIME  PER WEEK     Apoaequorin (PREVAGEN PO) Take 1 tablet by mouth daily.     aspirin EC 81 MG tablet Take 1 tablet every day by oral route.     BIOTIN PO Take 1 tablet by mouth daily. (Patient not taking: Reported on 07/11/2020)     Calcium Carb-Cholecalciferol (CALTRATE 600+D3 SOFT) 600-20 MG-MCG CHEW chew 1 tablet 3 times a day     Calcium Carbonate-Vit D-Min (CALTRATE 600+D PLUS MINERALS) 600-800 MG-UNIT CHEW Chew 1 Units by mouth daily.     Coenzyme Q10 (CO Q 10 PO) Take 400 mg by mouth daily.     ergocalciferol (VITAMIN D2) 1.25 MG (50000 UT) capsule Take 1 capsule by mouth once a week.     Multiple Vitamins-Minerals (OCUVITE ADULT 50+) CAPS Take  1 capsule by mouth daily.     nitrofurantoin (MACRODANTIN) 100 MG capsule Take 1 capsule twice a day by oral route with meals for 7 days.     pantoprazole (PROTONIX) 40 MG tablet      phenazopyridine (PYRIDIUM) 200 MG tablet Take 1 tablet by mouth 3 (three) times daily.     sertraline (ZOLOFT) 25 MG tablet Take 25 mg by mouth daily.     No current facility-administered medications on file prior to visit.     Allergies  Allergen Reactions   Cephalexin Hives, Itching and Swelling    RASH RASH    Hydrocodone-Acetaminophen Itching, Swelling and Hives   Naproxen Itching, Swelling and Rash    RASH RASH    Morphine     Other reaction(s): Other (See Comments) MADE PT SICK TO STOMACH   Morphine And Related     MADE PT SICK TO STOMACH   Sulfa Antibiotics     SICK TO STOMACH   Sulfacetamide Sodium    Sulfasalazine     Other reaction(s): Other, Other (See Comments) SICK TO STOMACH    Vicodin [Hydrocodone-Acetaminophen] Hives and Itching    Social History   Occupational History   Not on file  Tobacco Use   Smoking status: Never   Smokeless tobacco: Never  Vaping Use   Vaping Use: Never used  Substance and Sexual Activity   Alcohol use: Not Currently    Comment: SOMETIMES COUPLE OF GLASSES OF WINE A DAY   Drug use: No   Sexual activity: Not on file    Family History  Problem Relation Age of Onset   Hypertension Mother    Breast cancer Mother    Stroke Mother    Coronary artery disease Brother     Immunization History  Administered Date(s) Administered   [employer] Vaccination 07/21/2019, 08/22/2019, 04/20/2020   Pfizer Covid-19 Vaccine Bivalent Booster 19yrs & up 03/01/2021    Objective: There were no vitals filed for this visit.  Bianca Williams is a pleasant 66 y.o. female in NAD. AAO X 3.  Objective:   Vascular Examination: Vascular status intact b/l with palpable pedal pulses. Pedal hair present b/l. CFT immediate b/l. No edema. No pain with  calf compression b/l. Skin temperature gradient WNL b/l.   Neurological Examination: Sensation grossly intact b/l with 10 gram monofilament. Vibratory sensation intact b/l.   Dermatological Examination: Pedal skin with normal turgor, texture and tone b/l.Toenails 1-5 b/l well maintained with adequate length. No erythema, no edema, no drainage, no fluctuance. Hyperkeratotic lesion(s) medial DIPJ of R 5th toe and lateral PIPJ of R 4th toe.  No erythema, no edema, no drainage, no fluctuance.  Musculoskeletal Examination: Muscle  strength 5/5 to b/l LE. Adductovarus deformity bilateral 5th toes.  Radiographs: None Footwear Assessment: Does the patient wear appropriate shoes? Yes. Does the patient need inserts/orthotics? No.  Assessment: 1. Corns   2. Acquired hammertoes of both feet   3. Type 2 diabetes mellitus without complication, without long-term current use of insulin (HCC)   4. Encounter for diabetic foot exam (HCC)     \ ADA Risk Categorization: Low Risk:  Patient has all of the following: Intact protective sensation No prior foot ulcer  No severe deformity Pedal pulses present  Plan: -Patient was evaluated and treated. All patient's and/or POA's questions/concerns answered on today's visit. -Diabetic foot examination performed today. -Discussed and educated patient on diabetic foot care, especially with  regards to the vascular, neurological and musculoskeletal systems. -Patient to continue soft, supportive shoe gear daily. -Corn(s) R 4th toe and R 5th toe pared utilizing sterile scalpel blade without complication or incident. Total number debrided=2. -Dispensed padding. Apply to R 5th toe every morning. Remove every evening. -Patient/POA to call should there be question/concern in the interim.  Return in about 3 months (around 04/11/2022).  Freddie Breech, DPM

## 2022-01-28 DIAGNOSIS — G3184 Mild cognitive impairment, so stated: Secondary | ICD-10-CM

## 2022-01-28 DIAGNOSIS — R413 Other amnesia: Secondary | ICD-10-CM | POA: Insufficient documentation

## 2022-01-28 DIAGNOSIS — R42 Dizziness and giddiness: Secondary | ICD-10-CM | POA: Insufficient documentation

## 2022-01-28 DIAGNOSIS — R2689 Other abnormalities of gait and mobility: Secondary | ICD-10-CM | POA: Insufficient documentation

## 2022-01-28 HISTORY — DX: Mild cognitive impairment of uncertain or unknown etiology: G31.84

## 2022-02-05 ENCOUNTER — Ambulatory Visit: Payer: Medicare Other | Admitting: Podiatry

## 2022-04-24 ENCOUNTER — Ambulatory Visit: Payer: Medicare Other | Admitting: Podiatry

## 2022-08-28 ENCOUNTER — Encounter: Payer: Self-pay | Admitting: Podiatry

## 2022-08-28 ENCOUNTER — Ambulatory Visit: Payer: Medicare Other | Admitting: Podiatry

## 2022-08-28 VITALS — BP 119/78 | HR 62

## 2022-08-28 DIAGNOSIS — L84 Corns and callosities: Secondary | ICD-10-CM | POA: Diagnosis not present

## 2022-08-28 DIAGNOSIS — E119 Type 2 diabetes mellitus without complications: Secondary | ICD-10-CM | POA: Diagnosis not present

## 2022-08-28 NOTE — Progress Notes (Signed)
  Subjective:  Patient ID: Bianca Williams, female    DOB: [birth year],  MRN: [id number]  Bianca Williams presents to clinic today for corn(s) right foot which interfere(s) with ambulation. Aggravating factors include wearing enclosed shoe gear. Pain is relieved with periodic professional debridement.  Chief Complaint  Patient presents with   Callouses    Right foot, near 5th digit in between 4th and 5th digit, prior treatment has been otc callouse remover but ineffective    New problem(s): None.   PCP is Sistasis, Lowell Guitar, MD.  Allergies  Allergen Reactions   Cephalexin Hives, Itching and Swelling    RASH RASH    Hydrocodone-Acetaminophen Itching, Swelling and Hives   Naproxen Itching, Swelling and Rash    RASH RASH    Donepezil Other (See Comments) and Nausea Only   Morphine     Other reaction(s): Other (See Comments) MADE PT SICK TO STOMACH   Morphine And Related Other (See Comments)    MADE PT SICK TO STOMACH   Sulfa Antibiotics     SICK TO STOMACH   Sulfacetamide Sodium    Sulfasalazine     Other reaction(s): Other, Other (See Comments) SICK TO STOMACH    Vicodin [Hydrocodone-Acetaminophen] Hives and Itching    Review of Systems: Negative except as noted in the HPI.  Objective: No changes noted in today's physical examination. Vitals:   08/28/22 1012  BP: 119/78  Pulse: 62   Bianca Williams is a pleasant 67 y.o. female WD, WN in NAD. AAO x 3.  Vascular Examination: Vascular status intact b/l with palpable pedal pulses. Pedal hair present b/l. CFT immediate b/l. No edema. No pain with calf compression b/l. Skin temperature gradient WNL b/l.   Neurological Examination: Sensation grossly intact b/l with 10 gram monofilament. Vibratory sensation intact b/l.   Dermatological Examination: Pedal skin with normal turgor, texture and tone b/l.  Toenails 1-5 b/l well maintained with adequate length. No erythema, no edema, no drainage, no fluctuance.    Macerated hyperkeratotic lesion(s) right 4th webspace.  No erythema, no edema, no drainage, no fluctuance.  Musculoskeletal Examination: Muscle strength 5/5 to b/l LE. Adductovarus deformity bilateral 5th toes.  Radiographs: None  Assessment/Plan: 1. Corns   2. Type 2 diabetes mellitus without complication, without long-term current use of insulin     -Consent given for treatment as described below: -Examined patient. -Continue foot and shoe inspections daily. Monitor blood glucose per PCP/Endocrinologist's recommendations. -Continue supportive shoe gear daily. -Corn(s) 4th webspace right foot pared utilizing sharp debridement with sterile blade without complication or incident. Total number debrided=1. -Patient/POA to call should there be question/concern in the interim.   Return in about 3 months (around 11/27/2022).  Freddie Breech, DPM

## 2022-12-04 ENCOUNTER — Ambulatory Visit: Payer: Medicare Other | Admitting: Podiatry

## 2023-10-12 NOTE — Progress Notes (Signed)
 Follow Up Telehealth Note 10/13/2023  Today's visit was completed via a real-time telehealth encounter while the patient is in [place]. A telehealth visit was utilized in order to decrease the patient's potential exposure to COVID-19 vs an in-person visit. The patient/authorized person was informed of the potential benefits, limitations, and risks of telemedicine, expressed understanding that the laws that protect confidentiality also apply to telemedicine, and provided oral consent at the time of the visit to engaging in a telemedicine encounter with the present provider at [facility].   Telehealth Modality: Phone visit (audio only) Total time spent in the clinical discussion 16 minutes.  Reason for Visit  Memory concerns.  Assessment  1) Memory impairment.  Possible mild cognitive impairment (MCI) versus mild dementia.  ATN profile on 09/30/2023 positive for amyloid and tau, consistent with Alzheimer's type pathology.  Would not exclude contributions from B12 deficiency, mood as well.  We reviewed pathophysiology, diagnostic considerations, treatment concepts and limitations as well as prognosis.  Have recommended B12 supplementation of 2000 mcg once daily.  Thus far tolerating low-dose rivastigmine and will continue with monthly titration up to maintenance dose of 6 mg twice daily.  Will maintain memantine for the time being but may consider discontinuation and reevaluation of reintroduction for more appropriate use during moderate to severe dementia.  Instructed to call with any undue effect from any treatment options or with any progression of symptoms of any concern.  Encouraged continued efforts at salubrious cognitive and physical activities including linkage with mental health provision as needed.  All posed questions were answered.  Next: Discontinue memantine, Exelon patch, repeat neuropsych testing  Plan  1) RTC as previously scheduled 2) Vitamin B12 2000 mcg once  daily  ___________ L. Charlyne, MD Certifications in Neurology, Clinical Neurophysiology, Neuroimaging  HPI  Bianca Williams is a 68 y.o. WF with memory impairment. She is accompanied by her husband by phone today.  Last visit the patient was provided an introductory dose of rivastigmine, and for the past 6 days that she has been on it seems to be tolerating this thus far  Additional screening labs were recommended and completed on 09/30/2023 and significant for low vitamin B12 of 145 pg/mL, positive amyloid and tau ratios consistent with Alzheimer's related pathology.  Patient was provided with website reference to look into counseling opportunities   Initial Hx: Reports STM loss ongoing for several years Alludes to stressors induced by sale of her parents home in [place]  by her other 2 siblings with the death of her last parent, her father, several years ago she still seems to be having struggles with Loss of daughter for 3 yrs who was subsequently found in [place]  and brought back  Attempted counseling but not actively undergoing counseling at this time  Sleep: improved Parasomnias: denied Hallucinations/illusions/delusions: denied Appetite: Adequate Dietary restrictions: Denied Ageusia/Anosmia: Denied  Naming/lethologica: Yes Conversational amnesia:  Misplacing items: Yes Insight: Adequate  Behavioral aberrancy: yes per daughter, noticing occasional aggressiveness, not her typical demeanor that the daughter has been accustomed to over years Sundowning:  Apraxis:  ADL impairments: Denied Med Assist:  Financial Assist:  Driving:   MedHx:  Cholinesterase inhibitor: Aricept not tolerated  Namenda: Currently on 15 mg/day, unable to tolerate standard 20 mg/day dose Behavioral: Mirtazapine, hydroxyzine  Diagnostic: Last Brain: MRI brain apparently repeated in the past year at direction of PCP.  Report and images presently unavailable for review. Last Neuropsych  testing: 04/02/22.  Emotional lability noted during testing leading to  unreliable test results.  Unspecified mood disorder.  Rule out MCI.  Reviewed last neurology note with Dr. Scot dated 07/15/2022.  Noticing no changes in memory, largely STM loss.  Driving,.  Lives with husband.  No behavioral problems.  Still working.  Did not tolerate Aricept.  Tolerating reduced dose of Namenda at 15 mg/day as 10 mg twice daily caused dizziness.  Rare headaches.  Occasional dizziness.  History of depression and vitamin D deficiency, fibromyalgia, anxiety, osteoporosis, vertigo.  History of gastric bypass.  Last MRI brain 07/17/2021 through [facility] imaging with mild to moderate white matter disease, mild atrophy.  MoCA 01/28/2022 scored 22/30.  Diagnosis of MCI.  Exam  There were no vitals taken for this visit.   PM/SH   Allergies  Allergen Reactions  . Cephalexin Hives, Itching and Swelling    RASH  . Hydrocodone-Acetaminophen  Hives, Itching and Swelling  . Naproxen Itching, Rash and Swelling    RASH  . Donepezil GI Intolerance and Dizziness  . Opioids - Morphine Analogues Other (See Comments)    MADE PT SICK TO STOMACH  . Sulfacetamide Sodium Swelling    SICK TO STOMACH  . Sulfasalazine Other (See Comments)    SICK TO STOMACH   Current Outpatient Medications  Medication Sig Dispense Refill  . Caltrate 600-D Plus Minerals 600 mg calcium- 800 unit-50 mg tab Take 1 tablet by mouth Once Daily.  12  . cyanocobalamin (VITAMIN B12) 1,000 mcg tablet Take 1,000 mcg by mouth Once Daily.    SABRA denosumab (Prolia) 60 mg/mL syrg syringe Inject 1 mL (60 mg total) under the skin every 6 (six) months. 1 mL 0  . ergocalciferol (VITAMIN D2) 1,250 mcg (50,000 unit) capsule Take 1 capsule (50,000 Units total) by mouth once a week. 15 capsule 3  . fluoride, sodium, 0.2 % soln RINSE ONCE AS DIRECTED AT BEDTIME    . hydroxychloroquine (PLAQUENIL) 200 mg tablet Take 1.5 tablets (300 mg total) by mouth daily. 135 tablet 3   . hydrOXYzine (ATARAX) 10 mg tablet hydroxyzine HCl 10 mg tablet TAKE 1 TABLET AT BEDTIME AS NEEDED    . meclizine (ANTIVERT) 25 mg tablet Take 25 mg by mouth as needed.    . memantine (NAMENDA) 10 mg tablet TAKE 1 AND 1/2 TABLETS DAILY BY MOUTH 135 tablet 0  . mirtazapine (REMERON) 7.5 mg tablet Take 7.5 mg by mouth nightly.    . rivastigmine (EXELON) 1.5 mg capsule Take 1 capsule (1.5 mg total) by mouth 2 (two) times a day. 180 capsule 3  . therapeutic multivitamin (Oncovite) tab Take  by mouth.    . vitamin A 2,400 mcg capsule Take 8,000 Units by mouth Once Daily.     No current facility-administered medications for this visit.   Past Medical History:  Diagnosis Date  . Depression   . Vitamin D deficiency    Past Surgical History:  Procedure Laterality Date  . CATARACT EXTRACTION Bilateral    Procedure: CATARACT EXTRACTION  . CESAREAN SECTION, UNSPECIFIED     Procedure: CESAREAN SECTION  . CHOLECYSTECTOMY     Procedure: CHOLECYSTECTOMY  . GASTRIC BYPASS  2011   Procedure: GASTRIC BYPASS  . ROTATOR CUFF REPAIR Left    Procedure: ROTATOR CUFF REPAIR  . TOTAL KNEE ARTHROPLASTY Bilateral    Procedure: TOTAL KNEE ARTHROPLASTY   Family History  Problem Relation Name Age of Onset  . Stroke Mother    . Breast cancer Mother    . Arthritis Mother    . Dementia  Father    . Seizures Neg Hx    . Parkinsonism Neg Hx    . Multiple sclerosis Neg Hx      DATA   Lab on 09/30/2023  Component Date Value Ref Range Status  . Ammonia 09/30/2023 36  18 - 72 umol/L Final   SLIGHT HEMOLYSIS  . A--Beta-amyloid 42/40 Ratio 09/30/2023 0.099 (L)  >0.102 Final  . Beta-amyloid 42 09/30/2023 12.84  pg/mL Final  . Beta-amyloid 40 09/30/2023 129.25  pg/mL Final  . T--p-tau181 09/30/2023 1.37 (H)  0.00 - 0.97 pg/mL Final  . N--Nfl 09/30/2023 4.53  0.00 - 6.04 pg/mL Final                 **Please note reference interval change**  . ATN Summary 09/30/2023 Comment   Final                           A+ T+ N- A low beta-amyloid 42/40 and a high pTau181 concentration were observed. A normal NfL concentration was observed at this time. These results are consistent with the presence of Alzheimer's related pathology. Plasma findings may be less precise than CSF or PET. Additional assessments may be necessary. These tests are intended to be used in the context of clinical care.  . Information: 09/30/2023 Comment   Final   Comment: Beta-amyloid 42 and Beta-amyloid 40: Plasma beta-amyloid 1-42/1-40 ratios less than or equal to 0.102 suggest a higher probability of a patient being clinically diagnosed with Alzheimer's Disease (AD), while values above 0.102 suggest a lower probability of AD diagnosis. Precise plasma testing of Beta-amyloid 42 and Beta-amyloid 40 has demonstrated comparable effectiveness to traditional cerebrospinal fluid testing and amyloid positron emission tomography (PET) scans. When assessing the risk of AD pathology as the underlying cause for mild cognitive impairment (MCI) or dementia, it is important to consider various factors such as medical and family history, nutritional deficiency biomarkers, neuroimaging, and physical, neurological, and neuropsychological examinations. These tests were developed and their performance characteristics determined by [facility]. They have not been cleared or approved by the Food and Drug Administration.                                                                                             * METHODOLOGY: Beta-amyloid 42/40 Ratio: Sysmex Chemiluminescence Enzyme Immunoassay (CLEIA) NfL and p-tau181: Tests performed by Roche Diagnostics Electrochemiluminescence Immunoassay (ECLIA). Values obtained with different methods cannot be used interchangeable. These tests were developed and their performance characteristics determined by [facility]. They have not been cleared or approved by the Food and Drug Administration.                                                                     * p-tau181 INFORMATION: For individual 12-38 years of age:  0.00-0.95 pg/mL Reference interval is based on a population  of ostensibly healthy individuals aged 78 to 16 years For individual greater than 65 years of age:  0.00-0.97 pg/mL Results greater than the clinical cut-off of 0.97 pg/mL in patients greater than 63 years of age are correlated with Abeta amyloid pathology as determined by amyloid PET imaging.                                                                                              * 1. Interpretation comments are based on a consensus between [facility] and the Internation Working Group recommendations for ATN panel interpretation published by Marinell dunker al 2018 and updated in [place] et al 2021.                                                                    *  Hampel H, Cummings J, Blennow K, Lia SHAUNNA Marinell [address] [place]. Developing the ATX(N) classification for use across the Alzheimer disease continiuum. Nat Rev Neurol. 2021 Sep;17(9):580-589.  Marinell CR Mickey Dolly DA, Blennow K, et al. A/T/N: An unbiased descriptive classification scheme for Alzheimer disease biomarkers. Neurology. 2016 Aug 2;87(5):539-547.  SABRA Cortisol 09/30/2023 8.4  ug/dL Final   Random: Not Established Cortisol (8 AM):  6.7 - 22.6 mcg/dL Cortisol (4 PM):  <89.9 mcg/dL or  49% of the 8 am level       . Vitamin B-12 09/30/2023 145 (L)  180 - 914 pg/mL Final   Normal Range: 180-914 pg/mL Indeterminate Range: 145-180 pg/mL  Deficient Range: <145 pg/mL   . RPR 09/30/2023 Non-Reactive  Non-Reactive Final  . Methylmalonic Acid (MMA) 09/30/2023 247  <400 nmol/L Final   In adults, moderately elevated values indicate a likely cobalamin (vitamin B12) deficiency.   In pediatric patients, markedly elevated methylmalonic acid values indicate a probable diagnosis of methylmalonic acidemia.  Additional confirmatory testing must be  performed.   This is a lab developed test (LDT) and its performance characteristics were determined by the [facility] in accordance with CAP and CLIA guidelines.  This test has not been cleared nor approved by the U.S. Food and Drug Administration.  The [facility] is certified under the Clinical Laboratory Improvement Amendments of 1988 (CLIA '88) as qualified to perform high complexity clinical laboratory testing, such as LDT's.   Lab Results  Component Value Date   NA 142 07/17/2023   K 3.9 07/17/2023   CL 103 07/17/2023   CREATININE 0.68 07/17/2023   BUN 20 07/17/2023   CO2 32 (H) 07/17/2023    IMAGING DATA   ____________________  MDM  Orders No orders of the defined types were placed in this encounter.  No orders of the defined types were placed in this encounter.

## 2024-02-01 NOTE — Telephone Encounter (Signed)
 Patient is calling in regards to her insurance . Stating they are not gonna cover the prolia anymore they are taking the drug off there list as of September 1st . So there adding jubbonti due to its a bio similar is being added to there drug list ? Asking to be switched to the jubbonti if the provider approves of it .   Pharmacy verified Unsure of the pharmacy states she was receiving it from us  .   Call back [phone]   Also asking if we are going to schedule her a appointment and if so can we reach out to her so she will know when it is .

## 2024-02-09 NOTE — Telephone Encounter (Signed)
  Roland with [employer] called to set up delivery of  Jubbonti.  PHone: [phone]

## 2024-02-10 NOTE — Telephone Encounter (Signed)
 Jubbonti scheduled for delivery tomorrow morning. Pt scheduled for ov tomorrow after noon. Pt voiced understanding

## 2024-02-11 NOTE — Progress Notes (Signed)
 Rheumatology at [facility] Date of Service: 02/11/2024 Patient Name: Bianca Williams [facility] Patient DOB: 02-06-1956  Vital Signs BP 147/84 (BP Location: Left arm, Patient Position: Sitting)   Pulse 71   Ht 1.549 m (5' 1)   Wt 56.3 kg (124 lb 3.2 oz)   SpO2 96%   BMI 23.47 kg/m  CC/HPI Bianca Williams is here for a follow up for arthralgia, osteoporosis Monitoring of chronic therapy.  No hot, swollen joints. Feeling less joint pains on plaquenil.   Rheumatology medications: plaquenil 300 mg daily, on Prolia for OP. Ophthalmology check up is due Pt tolerates tx well.   Patient is also being treated for: depression   Hx of cancers: no Infections: no  Physical Exam Pt is alert and oriented x 3. Answers questions appropriately.  Heart: normal S1, S2. No S3, no thrills. No JVD. Lungs: no dyspnea observed, normal breath sounds on auscultation.  Extremities: no cyanosis, no edema, no calves tenderness. Skin: warm, dry. No hot, swollen joints. OA changes in finger joints. B/l TKR   Assessment 1. Age-related osteoporosis without current pathological fracture  denosumab (PROLIA) syringe 60 mg   hydroxychloroquine (PLAQUENIL) 200 mg tablet    2. Arthralgia, unspecified joint  hydroxychloroquine (PLAQUENIL) 200 mg tablet    3. Other long term (current) drug therapy  hydroxychloroquine (PLAQUENIL) 200 mg tablet     Orders Continue plaquenil 300 mg a day for arthralgia Prolia injection(Denosumab-bbdz)given in office today (Patient supplied) Return for Prolia injection in 6 months Return in about 1 year (around 02/10/2025).  Electronic signature. I Devere Marcine Shams, RMA am acting as scribe for Dr. Aldona Ziolkowska,02/11/2024, 2:08 PM

## 2024-02-20 NOTE — Progress Notes (Unsigned)
 Cardiology Office Note:    Date:  02/23/2024   ID:  [name], DOB 1955/08/31, MRN [id number]  PCP:  Marelyn Quill, MD  Cardiologist:  Redell Leiter, MD    Referring MD: Marelyn Quill, MD    ASSESSMENT:    1. High risk medication use    PLAN:    In order of problems listed above:  Her concern is cardiotoxicity from hydroxychloroquine and the combination with Prozac QT interval is normal Nothing to suggest myocarditis I have offered her reassurance I do not think we need to do cardiac imaging at this time she seemed pleased with this approach   Next appointment: I will see back in the future as needed   Medication Adjustments/Labs and Tests Ordered: Current medicines are reviewed at length with the patient today.  Concerns regarding medicines are outlined above.  Orders Placed This Encounter  Procedures   EKG 12-Lead   No orders of the defined types were placed in this encounter.    History of Present Illness:    Bianca Williams is a 68 y.o. female with a hx of chest pain with  a coronary calcium score of 0 and normal coronary arteriography last seen 07/11/2020.  There is notation she is concerned about cardiac effects of medications including hydroxychloroquine and Prozac.  She is seeing his rheumatology for age-related osteoporosis and arthralgia.  Compliance with diet, lifestyle and medications: Yes  She was prescribed Prozac on top of hydroxychloroquine a comment was made that account affect the heart and she seeks my attention Fortunately she has no known history of heart disease congenital rheumatic or atrial fibrillation She is quite bothered by arthralgia but is not having chest pain edema shortness of breath palpitation or syncope Taking both drugs her EKG is normal including QT interval Past Medical History:  Diagnosis Date   Anemia 01/05/2018   Anxiety    Arthralgia 11/12/2017   Arthritis    Balance problem    PT ATTRIBUTES TO HER KNEE  ARTHRITIS-BONE ON BONE   Chest pain in adult 01/27/2018   Depression    RESOLVED   Diabetes mellitus (HCC)    Diabetes mellitus (HCC) 01/05/2018   Resolved after after gastric bypass    Fibromyalgia    Fractured    WRIST 12/26/2011 --CAST HAS BEEN REMOVED--STILL SORE AND HEALING   H/O gastric bypass 11/09/2013   Hemorrhoids    Intestinal malabsorption following gastrectomy 11/09/2013   MCI (mild cognitive impairment) 01/28/2022   THOUGHT TO BE RELATED TO LOW VITAMIN B--NOW ON SUPPLEMENT AND HAS NOTICED IMPROVEMENT     Memory problem    THOUGHT TO BE RELATED TO LOW VITAMIN B--NOW ON SUPPLEMENT AND HAS NOTICED IMPROVEMENT   OA (osteoarthritis) of knee 03/08/2012   Osteoporosis    Osteoporosis, senile 11/09/2013   Postop Acute blood loss anemia 03/09/2012   Postop Hypokalemia 03/10/2012   Secondary hyperparathyroidism 12/25/2015   Senile osteoporosis 11/09/2013   Vertigo    HX OF MILD EPISODES   Vitamin D deficiency 11/09/2013    Current Medications: Current Meds  Medication Sig   alendronate (FOSAMAX) 70 MG tablet TAKE 1 TABLET BY MOUTH ONE TIME PER WEEK   Apoaequorin (PREVAGEN PO) Take 1 tablet by mouth daily.   Calcium Carbonate-Vit D-Min (CALTRATE 600+D PLUS MINERALS) 600-800 MG-UNIT CHEW Chew 1 Units by mouth daily.   Cholecalciferol 1.25 MG (50000 UT) capsule Take 1 capsule by mouth once a week.   Coenzyme Q10 (CO Q 10 PO) Take 400  mg by mouth daily.   ergocalciferol (VITAMIN D2) 1.25 MG (50000 UT) capsule Take 1 capsule by mouth once a week.   estradiol (ESTRACE) 0.1 MG/GM vaginal cream APPLY TO THE LABIA OR VAGINALLY AT BEDRIME 3X A WEEK FOR MENOPAUSAL CHANGES.   FLUoxetine (PROZAC) 10 MG capsule Take 10 mg by mouth daily.   hydroxychloroquine (PLAQUENIL) 200 MG tablet Take 1.5 tablets by mouth daily.   JUBBONTI 60 MG/ML SOSY    meclizine (ANTIVERT) 25 MG tablet Take 25 mg by mouth 3 (three) times daily as needed.   memantine (NAMENDA) 10 MG tablet Take 10 mg by mouth 2  (two) times daily.   mirtazapine (REMERON) 7.5 MG tablet Take 7.5 mg by mouth at bedtime.   Multiple Vitamins-Minerals (OCUVITE ADULT 50+) CAPS Take 1 capsule by mouth daily.   phenazopyridine (PYRIDIUM) 200 MG tablet Take 1 tablet by mouth 3 (three) times daily.   rivastigmine (EXELON) 1.5 MG capsule Take 1.5 mg by mouth 2 (two) times daily.   [DISCONTINUED] denosumab (PROLIA) 60 MG/ML SOSY injection    [DISCONTINUED] sertraline (ZOLOFT) 25 MG tablet Take 25 mg by mouth daily.      EKGs/Labs/Other Studies Reviewed:    The following studies were reviewed today:  Cardiac Studies & Procedures   ______________________________________________________________________________________________   STRESS TESTS  MYOCARDIAL PERFUSION IMAGING 12/27/2017        CT SCANS  CT CORONARY MORPH W/CTA COR W/SCORE 03/25/2018  Addendum 03/25/2018  4:34 PM ADDENDUM REPORT: 03/25/2018 16:29  CLINICAL DATA:  31F with chest pain.  EXAM: Cardiac/Coronary  CT  TECHNIQUE: The patient was scanned on a [employer].  FINDINGS: A 120 kV prospective scan was triggered in the descending thoracic aorta at 111 HU's. Axial non-contrast 3 mm slices were carried out through the heart. The data set was analyzed on a dedicated work station and scored using the Agatson method. Gantry rotation speed was 250 msecs and collimation was .6 mm. No beta blockade and 0.8 mg of sl NTG was given. The 3D data set was reconstructed in 5% intervals of the 67-82 % of the R-R cycle. Diastolic phases were analyzed on a dedicated work station using MPR, MIP and VRT modes. The patient received 100 cc of contrast.  Aorta: Normal size. Ascending aorta 2.8 cm. No calcification. No dissection.  Aortic Valve:  Trileaflet.  No calcifications.  Coronary Arteries:  Normal coronary origin.  Right dominance.  RCA is a large dominant artery that gives rise to PDA and 3 small PLV branches. There is no plaque.  Left main  is a large artery that gives rise to LAD and LCX arteries.  LAD is a large vessel that has no plaque. There is a normal D1 with no plaque.  LCX is a non-dominant artery that gives rise to one large, branching OM. There is no plaque.  Other findings:  Normal pulmonary vein drainage into the left atrium.  Normal let atrial appendage without a thrombus.  Normal size of the pulmonary artery.  IMPRESSION: 1. Coronary calcium score of 0. This was 0 percentile for age and sex matched control.  2. Normal coronary origin with right dominance.  3. No evidence of CAD.  Annabella Scarce, MD   Electronically Signed By: Annabella Scarce On: 03/25/2018 16:29  Narrative EXAM: OVER-READ INTERPRETATION  CT CHEST  The following report is an over-read performed by radiologist Dr. Franky Crease of [facility] on 03/25/2018. This over-read does not include interpretation of cardiac or coronary  anatomy or pathology. The coronary CTA interpretation by the cardiologist is attached.  COMPARISON:  12/26/2017  FINDINGS: Vascular: Heart is normal size.  Visualized aorta is normal caliber.  Mediastinum/Nodes: No adenopathy in the lower mediastinum or hila.  Lungs/Pleura: Visualized lungs clear.  No effusions.  Upper Abdomen: Imaging into the upper abdomen shows no acute findings.  Musculoskeletal: Nodular densities are again noted in both breasts, shown on recent mammography to reflect stable benign nodules. Chest wall soft tissues otherwise unremarkable. Degenerative spurring anteriorly in the visualized thoracic spine.  IMPRESSION: No acute or significant extracardiac abnormality.  Electronically Signed: By: Franky Crease M.D. On: 03/25/2018 12:55     ______________________________________________________________________________________________      EKG Interpretation Date/Time:  Tuesday February 23 2024 14:44:13 EDT Ventricular Rate:  65 PR  Interval:  128 QRS Duration:  88 QT Interval:  440 QTC Calculation: 457 R Axis:   73  Text Interpretation: Normal sinus rhythm Normal ECG No previous ECGs available Confirmed by Monetta Rogue ([id number]) on 02/23/2024 2:47:02 PM   Recent Labs: No results found for requested labs within last 365 days.  Recent Lipid Panel    Component Value Date/Time   CHOL 165 07/11/2020 1436   TRIG 161 (H) 07/11/2020 1436   HDL 67 07/11/2020 1436   CHOLHDL 2.5 07/11/2020 1436   LDLCALC 71 07/11/2020 1436    Physical Exam:    VS:  BP 124/72   Pulse 65   Ht 5' 1 (1.549 m)   Wt 123 lb 3.2 oz (55.9 kg)   SpO2 97%   BMI 23.28 kg/m     Wt Readings from Last 3 Encounters:  02/23/24 123 lb 3.2 oz (55.9 kg)  07/11/20 120 lb 6.4 oz (54.6 kg)  01/28/18 117 lb 9.6 oz (53.3 kg)     GEN:  Well nourished, well developed in no acute distress HEENT: Normal NECK: No JVD; No carotid bruits LYMPHATICS: No lymphadenopathy CARDIAC: RRR, no murmurs, rubs, gallops RESPIRATORY:  Clear to auscultation without rales, wheezing or rhonchi  ABDOMEN: Soft, non-tender, non-distended MUSCULOSKELETAL:  No edema; No deformity  SKIN: Warm and dry NEUROLOGIC:  Alert and oriented x 3 PSYCHIATRIC:  Normal affect    Signed, Rogue Monetta, MD  02/23/2024 3:35 PM    [facility]

## 2024-02-22 ENCOUNTER — Other Ambulatory Visit: Payer: Self-pay

## 2024-02-23 ENCOUNTER — Encounter: Payer: Self-pay | Admitting: Cardiology

## 2024-02-23 ENCOUNTER — Ambulatory Visit: Attending: Cardiology | Admitting: Cardiology

## 2024-02-23 VITALS — BP 124/72 | HR 65 | Ht 61.0 in | Wt 123.2 lb

## 2024-02-23 DIAGNOSIS — Z79899 Other long term (current) drug therapy: Secondary | ICD-10-CM

## 2024-02-23 NOTE — Patient Instructions (Signed)
 Medication Instructions:  Your physician recommends that you continue on your current medications as directed. Please refer to the Current Medication list given to you today.  *If you need a refill on your cardiac medications before your next appointment, please call your pharmacy*  Lab Work: None If you have labs (blood work) drawn today and your tests are completely normal, you will receive your results only by: MyChart Message (if you have MyChart) OR A paper copy in the mail If you have any lab test that is abnormal or we need to change your treatment, we will call you to review the results.  Testing/Procedures: None  Follow-Up: At [facility], you and your health needs are our priority.  As part of our continuing mission to provide you with exceptional heart care, our providers are all part of one team.  This team includes your primary Cardiologist (physician) and Advanced Practice Providers or APPs (Physician Assistants and Nurse Practitioners) who all work together to provide you with the care you need, when you need it.  Your next appointment:   Follow up as needed  Provider:   Redell Leiter, MD    We recommend signing up for the patient portal called MyChart.  Sign up information is provided on this After Visit Summary.  MyChart is used to connect with patients for Virtual Visits (Telemedicine).  Patients are able to view lab/test results, encounter notes, upcoming appointments, etc.  Non-urgent messages can be sent to your provider as well.   To learn more about what you can do with MyChart, go to [email or web address].   Other Instructions None

## 2024-05-28 ENCOUNTER — Ambulatory Visit (HOSPITAL_BASED_OUTPATIENT_CLINIC_OR_DEPARTMENT_OTHER)
Admission: EM | Admit: 2024-05-28 | Discharge: 2024-05-28 | Disposition: A | Discharge status: Home / Self Care | Attending: Family Medicine | Admitting: Family Medicine

## 2024-05-28 ENCOUNTER — Encounter (HOSPITAL_BASED_OUTPATIENT_CLINIC_OR_DEPARTMENT_OTHER): Payer: Self-pay

## 2024-05-28 DIAGNOSIS — H01001 Unspecified blepharitis right upper eyelid: Secondary | ICD-10-CM | POA: Diagnosis not present

## 2024-05-28 DIAGNOSIS — H00011 Hordeolum externum right upper eyelid: Secondary | ICD-10-CM | POA: Diagnosis not present

## 2024-05-28 MED ORDER — ERYTHROMYCIN 5 MG/GM OP OINT: TOPICAL_OINTMENT | OPHTHALMIC | 0 refills | Status: AC

## 2024-05-28 NOTE — ED Triage Notes (Signed)
 Pt c/o a red/painful bump on the inner corner of her upper eyelid for the last week. She has tried a warm compress with slight relief.

## 2024-05-28 NOTE — Discharge Instructions (Signed)
 You do have 2 styes and some inflammation of the eyelid.  Recommend cleansing the eye with gentle soap and doing warm compresses multiple times a day.  Apply the antibiotic ointment 3-4 times a day to the area. Follow-up as needed

## 2024-05-28 NOTE — ED Provider Notes (Signed)
 " Bianca Williams    CSN: [id number] Arrival date & time: 05/28/24  1349      History   Chief Complaint Chief Complaint  Patient presents with   Eye Problem    HPI Bianca Williams is a 69 y.o. female.   Pt c/o a red/painful bump on the inner corner of her upper eyelid for the last week. She has tried a warm compress with slight relief. No vision changes.     Eye Problem   Past Medical History:  Diagnosis Date   Anemia 01/05/2018   Anxiety    Arthralgia 11/12/2017   Arthritis    Balance problem    PT ATTRIBUTES TO HER KNEE ARTHRITIS-BONE ON BONE   Chest pain in adult 01/27/2018   Depression    RESOLVED   Diabetes mellitus (HCC)    Diabetes mellitus (HCC) 01/05/2018   Resolved after after gastric bypass    Fibromyalgia    Fractured    WRIST 12/26/2011 --CAST HAS BEEN REMOVED--STILL SORE AND HEALING   H/O gastric bypass 11/09/2013   Hemorrhoids    Intestinal malabsorption following gastrectomy 11/09/2013   MCI (mild cognitive impairment) 01/28/2022   THOUGHT TO BE RELATED TO LOW VITAMIN B--NOW ON SUPPLEMENT AND HAS NOTICED IMPROVEMENT     Memory problem    THOUGHT TO BE RELATED TO LOW VITAMIN B--NOW ON SUPPLEMENT AND HAS NOTICED IMPROVEMENT   OA (osteoarthritis) of knee 03/08/2012   Osteoporosis    Osteoporosis, senile 11/09/2013   Postop Acute blood loss anemia 03/09/2012   Postop Hypokalemia 03/10/2012   Secondary hyperparathyroidism 12/25/2015   Senile osteoporosis 11/09/2013   Vertigo    HX OF MILD EPISODES   Vitamin D deficiency 11/09/2013    Patient Active Problem List   Diagnosis Date Noted   Vertigo 01/28/2022   Memory problem 01/28/2022   Balance problem 01/28/2022   Osteoporosis    Hemorrhoids    Fractured    Fibromyalgia    Depression    Arthritis    Anxiety    Chest pain in adult 01/27/2018   Anemia 01/05/2018   Diabetes mellitus (HCC) 01/05/2018   Arthralgia 11/12/2017   Secondary hyperparathyroidism 12/25/2015   H/O gastric  bypass 11/09/2013   Osteoporosis, senile 11/09/2013   Vitamin D deficiency 11/09/2013   Senile osteoporosis 11/09/2013   Postop Hypokalemia 03/10/2012   Postop Acute blood loss anemia 03/09/2012   OA (osteoarthritis) of knee 03/08/2012    Past Surgical History:  Procedure Laterality Date   CATARACT EXTRACTION, BILATERAL     CESAREAN SECTION  09/16/84   CHOLECYSTECTOMY  2003 OR 2004   GASTRIC BYPASS SURGERY  11/08/89   ROUX-EN -Y   HAND SURGERY     HERNIA REPAIR  ? 1995   UMBILICAL HERNIA REPAIR   LEFT ROTATOR CUFF  03/19/10   TOTAL KNEE ARTHROPLASTY  03/08/2012   Procedure: TOTAL KNEE ARTHROPLASTY;  Surgeon: Dempsey LULLA Moan, MD;  Location: [facility];  Service: Orthopedics;  Laterality: Right;   TOTAL KNEE ARTHROPLASTY Left 07/12/2012   Procedure: TOTAL KNEE ARTHROPLASTY;  Surgeon: Dempsey LULLA Moan, MD;  Location: [facility];  Service: Orthopedics;  Laterality: Left;    OB History   No obstetric history on file.      Home Medications    Prior to Admission medications  Medication Sig Start Date End Date Taking? Authorizing Provider  erythromycin  ophthalmic ointment Place a 1/2 inch ribbon of ointment into the lower eyelid 3-4 times a day 05/28/24  Yes  Adah Corning A, FNP  alendronate (FOSAMAX) 70 MG tablet TAKE 1 TABLET BY MOUTH ONE TIME PER WEEK    [provider]  Apoaequorin (PREVAGEN PO) Take 1 tablet by mouth daily.    [provider]  Calcium Carbonate-Vit D-Min (CALTRATE 600+D PLUS MINERALS) 600-800 MG-UNIT CHEW Chew 1 Units by mouth daily.    [provider]  Cholecalciferol 1.25 MG (50000 UT) capsule Take 1 capsule by mouth once a week. 06/19/14   [provider]  Coenzyme Q10 (CO Q 10 PO) Take 400 mg by mouth daily.    [provider]  ergocalciferol (VITAMIN D2) 1.25 MG (50000 UT) capsule Take 1 capsule by mouth once a week. 06/20/20   [provider]  estradiol (ESTRACE) 0.1 MG/GM vaginal cream APPLY TO THE LABIA OR VAGINALLY  AT BEDRIME 3X A WEEK FOR MENOPAUSAL CHANGES. 08/01/21   [provider]  FLUoxetine (PROZAC) 10 MG capsule Take 10 mg by mouth daily. 02/15/24   [provider]  hydroxychloroquine (PLAQUENIL) 200 MG tablet Take 1.5 tablets by mouth daily. 11/11/19   [provider]  JUBBONTI 60 MG/ML SOSY  02/04/24   [provider]  meclizine (ANTIVERT) 25 MG tablet Take 25 mg by mouth 3 (three) times daily as needed. 01/27/24   [provider]  memantine (NAMENDA) 10 MG tablet Take 10 mg by mouth 2 (two) times daily. 12/28/23   [provider]  mirtazapine (REMERON) 7.5 MG tablet Take 7.5 mg by mouth at bedtime. 01/31/24   [provider]  Multiple Vitamins-Minerals (OCUVITE ADULT 50+) CAPS Take 1 capsule by mouth daily.    [provider]  phenazopyridine (PYRIDIUM) 200 MG tablet Take 1 tablet by mouth 3 (three) times daily.    [provider]  rivastigmine (EXELON) 1.5 MG capsule Take 1.5 mg by mouth 2 (two) times daily. 01/04/24   [provider]    Family History Family History  Problem Relation Age of Onset   Hypertension Mother    Breast cancer Mother    Stroke Mother    Coronary artery disease Brother     Social History Social History[1]   Allergies   Cephalexin, Hydrocodone-acetaminophen , Naproxen, Donepezil, Morphine, Morphine and codeine, Sulfa antibiotics, Sulfacetamide sodium, Sulfasalazine, and Vicodin [hydrocodone-acetaminophen ]   Review of Systems Review of Systems See HPI  Physical Exam Triage Vital Signs ED Triage Vitals  Encounter Vitals Group     BP 05/28/24 1504 (!) 167/92     Girls Systolic BP Percentile --      Girls Diastolic BP Percentile --      Boys Systolic BP Percentile --      Boys Diastolic BP Percentile --      Pulse Rate 05/28/24 1504 63     Resp 05/28/24 1504 20     Temp 05/28/24 1504 98.3 F (36.8 C)     Temp Source 05/28/24 1504 Oral     SpO2 05/28/24 1504 95 %     Weight  --      Height --      Head Circumference --      Peak Flow --      Pain Score 05/28/24 1502 1     Pain Loc --      Pain Education --      Exclude from Growth Chart --    No data found.  Updated Vital Signs BP (!) 167/92 (BP Location: Right Arm)   Pulse 63   Temp 98.3 F (  36.8 C) (Oral)   Resp 20   SpO2 95%   Visual Acuity Right Eye Distance:   Left Eye Distance:   Bilateral Distance:    Right Eye Near:   Left Eye Near:    Bilateral Near:     Physical Exam Vitals and nursing note reviewed.  Constitutional:      General: She is not in acute distress.    Appearance: Normal appearance. She is not ill-appearing, toxic-appearing or diaphoretic.  Eyes:     Extraocular Movements: Extraocular movements intact.     Conjunctiva/sclera: Conjunctivae normal.     Pupils: Pupils are equal, round, and reactive to light.     Comments: Swelling, erythema to right upper eyelid. 2 tiny styes noted to inner upper eyelid.   Pulmonary:     Effort: Pulmonary effort is normal.  Neurological:     Mental Status: She is alert.  Psychiatric:        Mood and Affect: Mood normal.      UC Treatments / Results  Labs (all labs ordered are listed, but only abnormal results are displayed) Labs Reviewed - No data to display  EKG   Radiology No results found.  Procedures Procedures (including critical Williams time)  Medications Ordered in UC Medications - No data to display  Initial Impression / Assessment and Plan / UC Course  I have reviewed the triage vital signs and the nursing notes.  Pertinent labs & imaging results that were available during my Williams of the patient were reviewed by me and considered in my medical decision making (see chart for details).     Blepharitis and hordeolum externum of the right upper eyelid.  Recommend warm compresses to the area.  Cleanse with gentle baby soap multiple times a day along with warm compresses.  Then apply the erythromycin  ointment.   Follow-up as needed Final Clinical Impressions(s) / UC Diagnoses   Final diagnoses:  Hordeolum externum of right upper eyelid  Blepharitis of right upper eyelid, unspecified type     Discharge Instructions      You do have 2 styes and some inflammation of the eyelid.  Recommend cleansing the eye with gentle soap and doing warm compresses multiple times a day.  Apply the antibiotic ointment 3-4 times a day to the area. Follow-up as needed    ED Prescriptions     Medication Sig Dispense Auth. Provider   erythromycin  ophthalmic ointment Place a 1/2 inch ribbon of ointment into the lower eyelid 3-4 times a day 3.5 g Adah Corning A, FNP      PDMP not reviewed this encounter.     [1]  Social History Tobacco Use   Smoking status: Never   Smokeless tobacco: Never  Vaping Use   Vaping status: Never Used  Substance Use Topics   Alcohol use: Not Currently    Comment: SOMETIMES COUPLE OF GLASSES OF WINE A DAY   Drug use: No     Adah Corning LABOR, FNP 05/29/24 0818  "
